# Patient Record
Sex: Female | Born: 1995 | Race: White | Hispanic: No | Marital: Single | Smoking: Former smoker
Health system: Southern US, Community
[De-identification: ages and names within clinical notes are randomized; demographics above are authoritative.]

## PROBLEM LIST (undated history)

## (undated) DIAGNOSIS — F419 Anxiety disorder, unspecified: Secondary | ICD-10-CM

## (undated) DIAGNOSIS — K5900 Constipation, unspecified: Secondary | ICD-10-CM

## (undated) DIAGNOSIS — R079 Chest pain, unspecified: Secondary | ICD-10-CM

## (undated) DIAGNOSIS — R0602 Shortness of breath: Secondary | ICD-10-CM

## (undated) DIAGNOSIS — G4730 Sleep apnea, unspecified: Secondary | ICD-10-CM

## (undated) DIAGNOSIS — F32A Depression, unspecified: Secondary | ICD-10-CM

## (undated) HISTORY — DX: Depression, unspecified: F32.A

## (undated) HISTORY — DX: Shortness of breath: R06.02

## (undated) HISTORY — DX: Constipation, unspecified: K59.00

## (undated) HISTORY — DX: Chest pain, unspecified: R07.9

## (undated) HISTORY — DX: Anxiety disorder, unspecified: F41.9

## (undated) HISTORY — DX: Sleep apnea, unspecified: G47.30

## (undated) HISTORY — PX: TONSILLECTOMY: SUR1361

---
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
## 2015-05-29 ENCOUNTER — Encounter (HOSPITAL_COMMUNITY): Payer: Self-pay | Admitting: Emergency Medicine

## 2015-05-29 ENCOUNTER — Emergency Department (HOSPITAL_COMMUNITY)
Admission: EM | Admit: 2015-05-29 | Discharge: 2015-05-29 | Disposition: A | Discharge status: Home / Self Care | Payer: Self-pay | Attending: Emergency Medicine | Admitting: Emergency Medicine

## 2015-05-29 DIAGNOSIS — R1032 Left lower quadrant pain: Secondary | ICD-10-CM | POA: Insufficient documentation

## 2015-05-29 LAB — COMPREHENSIVE METABOLIC PANEL
ALK PHOS: 51 U/L (ref 38–126)
ALT: 14 U/L (ref 14–54)
AST: 15 U/L (ref 15–41)
Albumin: 4.2 g/dL (ref 3.5–5.0)
Anion gap: 9 (ref 5–15)
BUN: 13 mg/dL (ref 6–20)
CALCIUM: 9.3 mg/dL (ref 8.9–10.3)
CO2: 26 mmol/L (ref 22–32)
Chloride: 103 mmol/L (ref 101–111)
Creatinine, Ser: 0.85 mg/dL (ref 0.44–1.00)
GFR calc Af Amer: >60 mL/min (ref >60)
GLUCOSE: 97 mg/dL (ref 65–99)
Potassium: 4.1 mmol/L (ref 3.5–5.1)
SODIUM: 138 mmol/L (ref 135–145)
Total Bilirubin: 0.4 mg/dL (ref 0.3–1.2)
Total Protein: 7.8 g/dL (ref 6.5–8.1)

## 2015-05-29 LAB — URINALYSIS, ROUTINE W REFLEX MICROSCOPIC
Bilirubin Urine: NEGATIVE
Glucose, UA: NEGATIVE mg/dL
Ketones, ur: NEGATIVE mg/dL
Nitrite: NEGATIVE
PROTEIN: 30 mg/dL — AB
Specific Gravity, Urine: 1.027 (ref 1.005–1.030)
Urobilinogen, UA: 0.2 mg/dL (ref 0.0–1.0)
pH: 6.5 (ref 5.0–8.0)

## 2015-05-29 LAB — URINE MICROSCOPIC-ADD ON

## 2015-05-29 LAB — WET PREP, GENITAL
Trich, Wet Prep: NONE SEEN
Yeast Wet Prep HPF POC: NONE SEEN

## 2015-05-29 LAB — CBC
HCT: 42.1 % (ref 36.0–46.0)
Hemoglobin: 13.7 g/dL (ref 12.0–15.0)
MCH: 27 pg (ref 26.0–34.0)
MCHC: 32.5 g/dL (ref 30.0–36.0)
MCV: 83 fL (ref 78.0–100.0)
PLATELETS: 312 10*3/uL (ref 150–400)
RBC: 5.07 MIL/uL (ref 3.87–5.11)
RDW: 13.4 % (ref 11.5–15.5)
WBC: 12.5 10*3/uL — AB (ref 4.0–10.5)

## 2015-05-29 LAB — LIPASE, BLOOD: Lipase: 25 U/L (ref 22–51)

## 2015-05-29 LAB — I-STAT BETA HCG BLOOD, ED (MC, WL, AP ONLY): I-stat hCG, quantitative: <5 m[IU]/mL (ref <5)

## 2015-05-29 MED ORDER — IBUPROFEN 800 MG PO TABS: 800.0000 mg | ORAL_TABLET | Freq: Three times a day (TID) | ORAL | Status: DC

## 2015-05-29 NOTE — ED Notes (Signed)
Family at bedside. 

## 2015-05-29 NOTE — ED Notes (Signed)
Pt from home c/o lower abdominal pain, nausea, and vomiting, since Friday. She reports she felt constipated and took a laxative without relief. NO urinary symptoms

## 2015-05-29 NOTE — ED Provider Notes (Signed)
CSN: [id number]     Arrival date & time 05/29/15  4098 History   First MD Initiated Contact with Patient 05/29/15 0701     Chief Complaint  Patient presents with  . Abdominal Pain     (Consider location/radiation/quality/duration/timing/severity/associated sxs/prior Treatment) HPI Patient reports 2 days of lower abdominal pain. She reports it somewhat more to the left than the right. She has had a feeling like cramping is in menstrual pain as well as discomfort is in need to have a bowel movement. She tried a laxative and has had some loose stool since then but it has not relieved her pain. She does not have any associated back pain or fever. She reports she has been nauseated. She reports she did vomit once. She reports she ate at a [employer] Thursday evening and had a chicken, bacon wrap. She states she hasn't felt good since Friday. She however denies significant diarrhea. She reports  loose stool did not start until after trying the laxative. Her mother recalls that about a week ago she was taking some over-the-counter cranberry pills for some urinary symptoms. Patient denies currently having urinary symptoms. She denies abnormal vaginal discharge or bleeding. She denies sexual activity. History reviewed. No pertinent past medical history. History reviewed. No pertinent past surgical history. No family history on file. History  Substance Use Topics  . Smoking status: Never Smoker   . Smokeless tobacco: Not on file  . Alcohol Use: Yes     Comment: social   OB History    No data available     Review of Systems  10 Systems reviewed and are negative for acute change except as noted in the HPI.   Allergies  Review of patient's allergies indicates no known allergies.  Home Medications   Prior to Admission medications   Medication Sig Start Date End Date Taking? Authorizing Provider  ibuprofen (ADVIL,MOTRIN) 800 MG tablet Take 1 tablet (800 mg total) by mouth 3 (three) times daily.  05/29/15   Arby Barrette, MD   BP 130/85 mmHg  Pulse 85  Temp(Src) 98.1 F (36.7 C) (Oral)  Resp 18  SpO2 100%  LMP 05/08/2015 Physical Exam  Constitutional: She is oriented to person, place, and time. She appears well-developed and well-nourished.  HENT:  Head: Normocephalic and atraumatic.  Eyes: EOM are normal. Pupils are equal, round, and reactive to light.  Neck: Neck supple.  Cardiovascular: Normal rate, regular rhythm, normal heart sounds and intact distal pulses.   Pulmonary/Chest: Effort normal and breath sounds normal.  Abdominal: Soft. Bowel sounds are normal. She exhibits no distension. There is tenderness (Moderate lower abdominal pain diffusely. More to the left than the right. No guarding.).  Genitourinary:  Normal extraocular genitalia. Speculum examination moderate amount of whitish discharge in the vaginal vault. Cervix normal in appearance without friability. Bimanual examination no adnexal tenderness or cervical tenderness.  Musculoskeletal: Normal range of motion. She exhibits no edema.  Neurological: She is alert and oriented to person, place, and time. She has normal strength. Coordination normal. GCS eye subscore is 4. GCS verbal subscore is 5. GCS motor subscore is 6.  Skin: Skin is warm, dry and intact.  Psychiatric: She has a normal mood and affect.    ED Course  Procedures (including critical care time) Labs Review Labs Reviewed  WET PREP, GENITAL - Abnormal; Notable for the following:    Clue Cells Wet Prep HPF POC FEW (*)    WBC, Wet Prep HPF POC FEW (*)  All other components within normal limits  CBC - Abnormal; Notable for the following:    WBC 12.5 (*)    All other components within normal limits  URINALYSIS, ROUTINE W REFLEX MICROSCOPIC (NOT AT [facility]) - Abnormal; Notable for the following:    APPearance TURBID (*)    Hgb urine dipstick SMALL (*)    Protein, ur 30 (*)    Leukocytes, UA MODERATE (*)    All other components within normal  limits  URINE MICROSCOPIC-ADD ON - Abnormal; Notable for the following:    Squamous Epithelial / LPF MANY (*)    Bacteria, UA MANY (*)    All other components within normal limits  URINE CULTURE  LIPASE, BLOOD  COMPREHENSIVE METABOLIC PANEL  I-STAT BETA HCG BLOOD, ED ([facility], [facility], [facility] ONLY)  GC/CHLAMYDIA PROBE AMP ([place]) NOT AT [facility]    Imaging Review No results found.   EKG Interpretation None      MDM   Final diagnoses:  Left lower quadrant pain   Patient is otherwise well in appearance. She does have diffuse lower abdominal pain. This does not localize to the right and is somewhat more pronounced on the left. I have low suspicion for appendicitis. Pelvic examination is unremarkable and patient denies sexual activity. Consideration is given for possible viral versus food related illness. Patient shows no signs of dehydration, she is not febrile and is not having vomiting. At this time I do feel she is safe for discharge and conservative management.    Arby Barrette, MD 05/29/15 [phone]

## 2015-05-29 NOTE — ED Notes (Signed)
Pt made aware of need for urine sample however, is unable to void at this time. Pt was also asked not to drink anymore of her water and she replies  "I thought you wanted a urine sample". Pt was made aware that she may need a CT scan to please not drink anymore.

## 2015-05-29 NOTE — Discharge Instructions (Signed)
Abdominal Pain, Women °Abdominal (stomach, pelvic, or belly) pain can be caused by many things. It is important to tell your doctor: °· The location of the pain. °· Does it come and go or is it present all the time? °· Are there things that start the pain (eating certain foods, exercise)? °· Are there other symptoms associated with the pain (fever, nausea, vomiting, diarrhea)? °All of this is helpful to know when trying to find the cause of the pain. °CAUSES  °· Stomach: virus or bacteria infection, or ulcer. °· Intestine: appendicitis (inflamed appendix), regional ileitis (Crohn's disease), ulcerative colitis (inflamed colon), irritable bowel syndrome, diverticulitis (inflamed diverticulum of the colon), or cancer of the stomach or intestine. °· Gallbladder disease or stones in the gallbladder. °· Kidney disease, kidney stones, or infection. °· Pancreas infection or cancer. °· Fibromyalgia (pain disorder). °· Diseases of the female organs: °¨ Uterus: fibroid (non-cancerous) tumors or infection. °¨ Fallopian tubes: infection or tubal pregnancy. °¨ Ovary: cysts or tumors. °¨ Pelvic adhesions (scar tissue). °¨ Endometriosis (uterus lining tissue growing in the pelvis and on the pelvic organs). °¨ Pelvic congestion syndrome (female organs filling up with blood just before the menstrual period). °¨ Pain with the menstrual period. °¨ Pain with ovulation (producing an egg). °¨ Pain with an IUD (intrauterine device, birth control) in the uterus. °¨ Cancer of the female organs. °· Functional pain (pain not caused by a disease, may improve without treatment). °· Psychological pain. °· Depression. °DIAGNOSIS  °Your doctor will decide the seriousness of your pain by doing an examination. °· Blood tests. °· X-rays. °· Ultrasound. °· CT scan (computed tomography, special type of X-ray). °· MRI (magnetic resonance imaging). °· Cultures, for infection. °· Barium enema (dye inserted in the large intestine, to better view it with  X-rays). °· Colonoscopy (looking in intestine with a lighted tube). °· Laparoscopy (minor surgery, looking in abdomen with a lighted tube). °· Major abdominal exploratory surgery (looking in abdomen with a large incision). °TREATMENT  °The treatment will depend on the cause of the pain.  °· Many cases can be observed and treated at home. °· Over-the-counter medicines recommended by your caregiver. °· Prescription medicine. °· Antibiotics, for infection. °· Birth control pills, for painful periods or for ovulation pain. °· Hormone treatment, for endometriosis. °· Nerve blocking injections. °· Physical therapy. °· Antidepressants. °· Counseling with a psychologist or psychiatrist. °· Minor or major surgery. °HOME CARE INSTRUCTIONS  °· Do not take laxatives, unless directed by your caregiver. °· Take over-the-counter pain medicine only if ordered by your caregiver. Do not take aspirin because it can cause an upset stomach or bleeding. °· Try a clear liquid diet (broth or water) as ordered by your caregiver. Slowly move to a bland diet, as tolerated, if the pain is related to the stomach or intestine. °· Have a thermometer and take your temperature several times a day, and record it. °· Bed rest and sleep, if it helps the pain. °· Avoid sexual intercourse, if it causes pain. °· Avoid stressful situations. °· Keep your follow-up appointments and tests, as your caregiver orders. °· If the pain does not go away with medicine or surgery, you may try: °¨ Acupuncture. °¨ Relaxation exercises (yoga, meditation). °¨ Group therapy. °¨ Counseling. °SEEK MEDICAL CARE IF:  °· You notice certain foods cause stomach pain. °· Your home care treatment is not helping your pain. °· You need stronger pain medicine. °· You want your IUD removed. °· You feel faint or   lightheaded. °· You develop nausea and vomiting. °· You develop a rash. °· You are having side effects or an allergy to your medicine. °SEEK IMMEDIATE MEDICAL CARE IF:  °· Your  pain does not go away or gets worse. °· You have a fever. °· Your pain is felt only in portions of the abdomen. The right side could possibly be appendicitis. The left lower portion of the abdomen could be colitis or diverticulitis. °· You are passing blood in your stools (bright red or black tarry stools, with or without vomiting). °· You have blood in your urine. °· You develop chills, with or without a fever. °· You pass out. °MAKE SURE YOU:  °· Understand these instructions. °· Will watch your condition. °· Will get help right away if you are not doing well or get worse. °Document Released: 08/12/2007 Document Revised: 03/01/2014 Document Reviewed: 09/01/2009 °ExitCare® Patient Information ©2015 [employer]. This information is not intended to replace advice given to you by your health care provider. Make sure you discuss any questions you have with your health care provider. ° °

## 2015-05-30 LAB — GC/CHLAMYDIA PROBE AMP (~~LOC~~) NOT AT ARMC
Chlamydia: POSITIVE — AB
Neisseria Gonorrhea: NEGATIVE

## 2015-06-01 ENCOUNTER — Telehealth (HOSPITAL_COMMUNITY): Payer: Self-pay

## 2015-06-01 NOTE — Telephone Encounter (Signed)
Results received from [facility].  (+) Chlamydia. No antibiotic treatment or Prescription given for STD.  Chart to MD office for review.  [facility] form attached. 

## 2015-06-04 ENCOUNTER — Telehealth (HOSPITAL_COMMUNITY): Payer: Self-pay | Admitting: Emergency Medicine

## 2015-06-04 NOTE — Telephone Encounter (Signed)
Post ED Visit - Positive Culture Follow-up: Successful Patient Follow-Up   Positive Chlamydia culture   Patient discharged without antimicrobial prescription and treatment is now indicated  Organism is resistant to prescribed ED discharge antimicrobial  Patient with positive blood cultures  Changes discussed with ED provider: Ladona Mow, PA New antibiotic prescription: Azithromycin one gram PO x once Called to [facility] [phone]  Contacted patient, date 06/04/15, time 1602 ID verified, patient notified of positive Chlamydia and need for treatment. STD instructions provided, patient verbalized understanding. RX called to [facility] [phone].  Jiles Harold 06/04/2015, 4:15 PM

## 2016-08-09 ENCOUNTER — Encounter (HOSPITAL_COMMUNITY): Payer: Self-pay | Admitting: Emergency Medicine

## 2016-08-09 ENCOUNTER — Emergency Department (HOSPITAL_COMMUNITY)
Admission: EM | Admit: 2016-08-09 | Discharge: 2016-08-10 | Disposition: A | Discharge status: Home / Self Care | Payer: No Typology Code available for payment source | Attending: Emergency Medicine | Admitting: Emergency Medicine

## 2016-08-09 DIAGNOSIS — Y939 Activity, unspecified: Secondary | ICD-10-CM | POA: Insufficient documentation

## 2016-08-09 DIAGNOSIS — Y92410 Unspecified street and highway as the place of occurrence of the external cause: Secondary | ICD-10-CM | POA: Diagnosis not present

## 2016-08-09 DIAGNOSIS — Y999 Unspecified external cause status: Secondary | ICD-10-CM | POA: Insufficient documentation

## 2016-08-09 DIAGNOSIS — S40021A Contusion of right upper arm, initial encounter: Secondary | ICD-10-CM | POA: Insufficient documentation

## 2016-08-09 DIAGNOSIS — F17200 Nicotine dependence, unspecified, uncomplicated: Secondary | ICD-10-CM | POA: Insufficient documentation

## 2016-08-09 DIAGNOSIS — S4991XA Unspecified injury of right shoulder and upper arm, initial encounter: Secondary | ICD-10-CM | POA: Diagnosis present
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
## 2016-08-09 NOTE — ED Notes (Signed)
Pt called to be taken to treatment room x 1 with no answer 

## 2016-08-10 MED ORDER — CYCLOBENZAPRINE HCL 10 MG PO TABS: 10.0000 mg | ORAL_TABLET | Freq: Two times a day (BID) | ORAL | 0 refills | Status: DC | PRN

## 2016-08-10 MED ORDER — IBUPROFEN 800 MG PO TABS: 800.0000 mg | ORAL_TABLET | Freq: Three times a day (TID) | ORAL | 0 refills | Status: DC
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
## 2018-07-30 ENCOUNTER — Emergency Department (HOSPITAL_BASED_OUTPATIENT_CLINIC_OR_DEPARTMENT_OTHER)
Admission: EM | Admit: 2018-07-30 | Discharge: 2018-07-30 | Disposition: A | Discharge status: Home / Self Care | Payer: Self-pay | Attending: Emergency Medicine | Admitting: Emergency Medicine

## 2018-07-30 ENCOUNTER — Encounter (HOSPITAL_BASED_OUTPATIENT_CLINIC_OR_DEPARTMENT_OTHER): Payer: Self-pay | Admitting: *Deleted

## 2018-07-30 ENCOUNTER — Other Ambulatory Visit: Payer: Self-pay

## 2018-07-30 DIAGNOSIS — F17200 Nicotine dependence, unspecified, uncomplicated: Secondary | ICD-10-CM | POA: Insufficient documentation

## 2018-07-30 DIAGNOSIS — N750 Cyst of Bartholin's gland: Secondary | ICD-10-CM | POA: Insufficient documentation

## 2018-07-30 LAB — PREGNANCY, URINE: Preg Test, Ur: NEGATIVE

## 2018-07-30 MED ORDER — LIDOCAINE HCL (PF) 1 % IJ SOLN
10.0000 mL | Freq: Once | INTRAMUSCULAR | Status: DC
Start: 2018-07-30 — End: 2018-07-30

## 2018-07-30 MED ORDER — CEPHALEXIN 500 MG PO CAPS: 500.0000 mg | ORAL_CAPSULE | Freq: Four times a day (QID) | ORAL | 0 refills | Status: AC

## 2018-07-30 NOTE — Discharge Instructions (Addendum)
You can take Tylenol or Ibuprofen as directed for pain. You can alternate Tylenol and Ibuprofen every 4 hours. If you take Tylenol at 1pm, then you can take Ibuprofen at 5pm. Then you can take Tylenol again at 9pm.   Take antibiotics as directed. Please take all of your antibiotics until finished.  As we discussed[facility].  As we discussed today, it appears that you have a Bartholin cyst.  This sometimes can turn into a Bartholin's abscess.  Please closely monitor your symptoms.  Return to the emergency department for any worsening pain in the area, particularly if it is worse with light touch, worsening redness or swelling, fevers or any other worsening or concerning symptoms.

## 2018-07-30 NOTE — ED Triage Notes (Signed)
Pt reports "knot" to her labia, tender to touch. Denies any fevers or other c/o.

## 2018-07-30 NOTE — ED Provider Notes (Signed)
[facility] EMERGENCY DEPARTMENT Provider Note   CSN: [id number] Arrival date & time: 07/30/18  1032     History   Chief Complaint Chief Complaint  Patient presents with  . Abscess    HPI Amanda [name] is a 22 y.o. female no significant past medical history who presents for evaluation of a "bump" on the left vaginal area that has been there for the last 24 hours.  Patient reports that she noticed a swollen area yesterday and was concerned about it.  She states it is slightly uncomfortable but she has been able to sit and walk without any difficulty.  She has not taken any medication for pain.  Patient states she has not noticed any erythema.  She states that it may be appeared slightly hot.  She has not noticed any drainage from the area.  Patient states that she has not had any fevers.  She has been able to urinate without any difficulty.  Patient reports she is currently sexually active with one partner.  They do not use protection.  She states that she had a history of chlamydia probably 4 years ago but no other STDs.  The history is provided by the patient.    History reviewed. No pertinent past medical history.  There are no active problems to display for this patient.   Past Surgical History:  Procedure Laterality Date  . TONSILLECTOMY       OB History   None      Home Medications    Prior to Admission medications   Medication Sig Start Date End Date Taking? Authorizing Provider  cephALEXin (KEFLEX) 500 MG capsule Take 1 capsule (500 mg total) by mouth 4 (four) times daily for 7 days. 07/30/18 08/06/18  Maxwell Caul, PA-C  cyclobenzaprine (FLEXERIL) 10 MG tablet Take 1 tablet (10 mg total) by mouth 2 (two) times daily as needed for muscle spasms. 08/10/16   Fayrene Helper, PA-C  ibuprofen (ADVIL,MOTRIN) 800 MG tablet Take 1 tablet (800 mg total) by mouth 3 (three) times daily. 08/10/16   Fayrene Helper, PA-C    Family History History reviewed. No  pertinent family history.  Social History Social History   Tobacco Use  . Smoking status: Current Every Day Smoker  . Smokeless tobacco: Never Used  Substance Use Topics  . Alcohol use: Yes    Comment: social  . Drug use: Not on file     Allergies   Patient has no known allergies.   Review of Systems Review of Systems  Constitutional: Negative for fever.  Genitourinary: Positive for vaginal pain. Negative for dysuria, hematuria, vaginal bleeding and vaginal discharge.  All other systems reviewed and are negative.    Physical Exam Updated Vital Signs BP 133/83 (BP Location: Right Wrist)   Pulse 92   Temp 98.4 F (36.9 C) (Oral)   Resp 16   Ht 5\' 9"  (1.753 m)   Wt 122.5 kg   LMP 07/13/2018   SpO2 100%   BMI 39.87 kg/m   Physical Exam  Constitutional: She appears well-developed and well-nourished.  HENT:  Head: Normocephalic and atraumatic.  Eyes: Conjunctivae and EOM are normal. Right eye exhibits no discharge. Left eye exhibits no discharge. No scleral icterus.  Pulmonary/Chest: Effort normal.  Genitourinary:     Genitourinary Comments: The exam was performed with a chaperone present. Normal external female genitalia. No lesions, rash. Area of edema noted to the inferior left labia consistent with Bartholin's cyst. No fluctuance, overlying warmth,  or erythema. Minimally tender to palpation.   Neurological: She is alert.  Skin: Skin is warm and dry.  Psychiatric: She has a normal mood and affect. Her speech is normal and behavior is normal.  Nursing note and vitals reviewed.    ED Treatments / Results  Labs (all labs ordered are listed, but only abnormal results are displayed) Labs Reviewed  PREGNANCY, URINE    EKG None  Radiology No results found.  Procedures Procedures (including critical care time)  Medications Ordered in ED Medications - No data to display   Initial Impression / Assessment and Plan / ED Course  I have reviewed the  triage vital signs and the nursing notes.  Pertinent labs & imaging results that were available during my care of the patient were reviewed by me and considered in my medical decision making (see chart for details).     22 year old female who presents for evaluation of swelling to left labia x24 hours.  Reports it is uncomfortable but has been able to sit without any difficulty.  No fevers.  Has not noticed any discharge.  No difficulty urinating. Patient is afebrile, non-toxic appearing, sitting comfortably on examination table. Vital signs reviewed and stable.  On exam, patient has an area of edema noted to the left labial.  No overlying fluctuance, overlying warmth, erythema.  On palpation, it appears consistent with a cyst rather than an abscess additionally, the patient has minimal tenderness over the area.  Bedside ultrasound performed showed approximately 2 cm area.  At this time, it does not appear to be consistent with an abscess that would require I&D here in the ED.   I discussed with patient that a Bartholin cyst can lead to a Bartholin's abscess.  Given exam, will plan to refer her outpatient to OB/GYN for further evaluation.  Patient is agreeable.  I discussed with patient regarding strict return precautions and that if the area becomes bigger, more painful, warm or erythematous, she needs to come to the ED for further evaluation as it potentially could turn into an abscess. Patient had ample opportunity for questions and discussion. All patient's questions were answered with full understanding. Strict return precautions discussed. Patient expresses understanding and agreement to plan.   Final Clinical Impressions(s) / ED Diagnoses   Final diagnoses:  Bartholin's cyst    ED Discharge Orders         Ordered    cephALEXin (KEFLEX) 500 MG capsule  4 times daily     07/30/18 1319           Rosana Hoes 07/30/18 2008    Alvira Monday, MD 07/31/18 [phone]

## 2018-08-01 ENCOUNTER — Encounter: Payer: Self-pay | Admitting: Obstetrics & Gynecology

## 2018-08-01 ENCOUNTER — Ambulatory Visit (INDEPENDENT_AMBULATORY_CARE_PROVIDER_SITE_OTHER): Payer: Self-pay | Admitting: Obstetrics & Gynecology

## 2018-08-01 VITALS — BP 120/64 | HR 78 | Ht 69.0 in | Wt 268.3 lb

## 2018-08-01 DIAGNOSIS — N750 Cyst of Bartholin's gland: Secondary | ICD-10-CM

## 2018-08-01 NOTE — Progress Notes (Signed)
   Subjective:    Patient ID: Amanda [name], female    DOB: [birth year], 22 y.o.   MRN: [id number]  HPI  22 yo P0 G0 here for evaluation of a left Bartholin's cyst for the last 4-7 days. She was seen recently in the ER and given the diagnosis and a referral for this office. She is using a homeopathic salve on the cyst and thinks that it has decreased in size somewhat.   Review of Systems She lives with her boyfriend. She works at [employer]. She has had her flu vaccine this season.    Objective:   Physical Exam Breathing, conversing, and ambulating normally Obese, well hydrated  female, no apparent distress Her labial exam is significant for a 4 cm left Bartholin's cyst. It is mildly tender with palpation, but not exquisitely so.     Assessment & Plan:  Preventative care- She plans to get her pap smear [facility] the health dept. I offered to I&D the cyst today but she is going on a trip in the near future and doesn't want to deal with a Word catheter during her trip. She will continue to use the salve. If the cyst is still there when she returns from her trip, she would like to have it drained at that time.

## 2018-08-01 NOTE — Progress Notes (Signed)
PHQ-9 and GAD-7 elevated. Pt declined to see [facility].  

## 2020-12-24 ENCOUNTER — Other Ambulatory Visit: Payer: Self-pay

## 2020-12-24 ENCOUNTER — Encounter (HOSPITAL_BASED_OUTPATIENT_CLINIC_OR_DEPARTMENT_OTHER): Payer: Self-pay | Admitting: Emergency Medicine

## 2020-12-24 ENCOUNTER — Emergency Department (HOSPITAL_BASED_OUTPATIENT_CLINIC_OR_DEPARTMENT_OTHER)
Admission: EM | Admit: 2020-12-24 | Discharge: 2020-12-24 | Disposition: A | Discharge status: Home / Self Care | Payer: BLUE CROSS/BLUE SHIELD | Attending: Emergency Medicine | Admitting: Emergency Medicine

## 2020-12-24 DIAGNOSIS — M79644 Pain in right finger(s): Secondary | ICD-10-CM | POA: Insufficient documentation

## 2020-12-24 DIAGNOSIS — R238 Other skin changes: Secondary | ICD-10-CM | POA: Diagnosis not present

## 2020-12-24 DIAGNOSIS — Z87891 Personal history of nicotine dependence: Secondary | ICD-10-CM | POA: Diagnosis not present

## 2020-12-24 DIAGNOSIS — L03019 Cellulitis of unspecified finger: Secondary | ICD-10-CM

## 2020-12-24 DIAGNOSIS — M7989 Other specified soft tissue disorders: Secondary | ICD-10-CM | POA: Diagnosis not present

## 2020-12-24 MED ORDER — CEPHALEXIN 250 MG PO CAPS
500.0000 mg | ORAL_CAPSULE | Freq: Once | ORAL | Status: AC
  Administered 2020-12-24: 500 mg via ORAL
  Filled 2020-12-24: qty 2

## 2020-12-24 MED ORDER — CEPHALEXIN 500 MG PO CAPS: 500.0000 mg | ORAL_CAPSULE | Freq: Four times a day (QID) | ORAL | 0 refills | Status: DC

## 2020-12-24 NOTE — Discharge Instructions (Signed)
Begin taking Keflex as prescribed.  Apply warm compresses as frequently as possible for the next several days.  Return to the emergency department if you develop increased pain, increased swelling, redness extending up the finger into the hand/wrist, or other new and concerning symptoms.

## 2020-12-24 NOTE — ED Triage Notes (Signed)
Pt reports right middle finger pain around the nail bed; cuticle area is red, swollen and hot to touch; pt denies injury or drainage; pt denies fever; CMS intact

## 2020-12-24 NOTE — ED Provider Notes (Signed)
[facility] EMERGENCY DEPARTMENT Provider Note   CSN: [id number] Arrival date & time: 12/24/20  2228     History Chief Complaint  Patient presents with  . Hand Pain    Amanda [name] is a 25 y.o. female.  Patient is a 25 year old female with no significant past medical history.  She presents with complaints of right middle finger pain.  Patient has swelling, discoloration, and discomfort adjacent to the nail of this finger.  She denies any injury or trauma.  She denies any fevers, chills, or streaks up the hand.  She has tried applying ice with little relief.  The history is provided by the patient.       History reviewed. No pertinent past medical history.  There are no problems to display for this patient.   Past Surgical History:  Procedure Laterality Date  . TONSILLECTOMY       OB History   No obstetric history on file.     No family history on file.  Social History   Tobacco Use  . Smoking status: Former Games developer  . Smokeless tobacco: Never Used  Substance Use Topics  . Alcohol use: Yes    Comment: social  . Drug use: Never    Home Medications Prior to Admission medications   Medication Sig Start Date End Date Taking? Authorizing Provider  acetaminophen (TYLENOL) 325 MG tablet Take 650 mg by mouth every 6 (six) hours as needed.    [provider]    Allergies    Patient has no known allergies.  Review of Systems   Review of Systems  All other systems reviewed and are negative.   Physical Exam Updated Vital Signs BP (!) 142/89   Pulse 98   Temp 97.9 F (36.6 C) (Oral)   Ht 5\' 9"  (1.753 m)   Wt 122.5 kg   SpO2 99%   BMI 39.87 kg/m   Physical Exam Vitals and nursing note reviewed.  Constitutional:      Appearance: Normal appearance.  HENT:     Head: Normocephalic and atraumatic.  Pulmonary:     Effort: Pulmonary effort is normal.  Skin:    General: Skin is warm and dry.     Comments: The right middle finger  has an area of erythema with a small amount of purulent material adjacent to the nail.  There is no tenderness or swelling of the finger pad.  Neurological:     Mental Status: She is alert.     ED Results / Procedures / Treatments   Labs (all labs ordered are listed, but only abnormal results are displayed) Labs Reviewed - No data to display  EKG None  Radiology No results found.  Procedures Procedures   Medications Ordered in ED Medications - No data to display  ED Course  I have reviewed the triage vital signs and the nursing notes.  Pertinent labs & imaging results that were available during my care of the patient were reviewed by me and considered in my medical decision making (see chart for details).    MDM Rules/Calculators/A&P  Patient presents with finger pain.  Exam and presentation consistent with a paronychia.  Patient offered incision, however declines.  She will be treated with an antibiotic and warm compresses and I believe will do well.  She is to return as needed if symptoms worsen.  This does not appear to be a felon.  Final Clinical Impression(s) / ED Diagnoses Final diagnoses:  None  Rx / DC Orders ED Discharge Orders    None       Geoffery Lyons, MD 12/24/20 2326

## 2021-03-03 ENCOUNTER — Other Ambulatory Visit: Payer: Self-pay

## 2021-03-03 ENCOUNTER — Encounter: Payer: Self-pay | Admitting: Nurse Practitioner

## 2021-03-03 ENCOUNTER — Other Ambulatory Visit: Payer: Self-pay | Admitting: Nurse Practitioner

## 2021-03-03 ENCOUNTER — Ambulatory Visit (INDEPENDENT_AMBULATORY_CARE_PROVIDER_SITE_OTHER): Payer: BLUE CROSS/BLUE SHIELD | Admitting: Nurse Practitioner

## 2021-03-03 VITALS — BP 139/82 | HR 87 | Ht 69.0 in | Wt 265.0 lb

## 2021-03-03 DIAGNOSIS — N611 Abscess of the breast and nipple: Secondary | ICD-10-CM | POA: Diagnosis not present

## 2021-03-03 MED ORDER — AMOXICILLIN-POT CLAVULANATE 875-125 MG PO TABS: 1.0000 | ORAL_TABLET | Freq: Two times a day (BID) | ORAL | 0 refills | Status: DC

## 2021-03-03 NOTE — Progress Notes (Signed)
Pain in right breast since Sunday and then felt a small lump on Wednesday

## 2021-03-03 NOTE — Patient Instructions (Signed)
Thank you for allowing me to be a part of your health care team. Take the antibiotics as directed. Apply warm wet compresses to the area for 15 minutes 3 times a day. Take tylenol or ibuprofen as needed for pain. Your ultrasound results will be in "My chart" and we will call you to discuss results as well.    Skin Abscess A skin abscess is an infected area of your skin that contains pus and other material. An abscess can happen in any part of your body. Some abscesses break open (rupture) on their own. Most continue to get worse unless they are treated. The infection can spread deeper into the body and into your blood, which can make you feel sick. A skin abscess is caused by germs that enter the skin through a cut or scrape. It can also be caused by blocked oil and sweat glands or infected hair follicles. This condition is usually treated by:  Draining the pus.  Taking antibiotic medicines.  Placing a warm, wet washcloth over the abscess. Follow these instructions at home: Medicines  Take over-the-counter and prescription medicines only as told by your doctor.  If you were prescribed an antibiotic medicine, take it as told by your doctor. Do not stop taking the antibiotic even if you start to feel better.   Abscess care  If you have an abscess that has not drained, place a warm, clean, wet washcloth over the abscess several times a day. Do this as told by your doctor.  Follow instructions from your doctor about how to take care of your abscess. Make sure you: ? Cover the abscess with a bandage (dressing). ? Change your bandage or gauze as told by your doctor. ? Wash your hands with soap and water before you change the bandage or gauze. If you cannot use soap and water, use hand sanitizer.  Check your abscess every day for signs that the infection is getting worse. Check for: ? More redness, swelling, or pain. ? More fluid or blood. ? Warmth. ? More pus or a bad smell.   General  instructions  To avoid spreading the infection: ? Do not share personal care items, towels, or hot tubs with others. ? Avoid making skin-to-skin contact with other people.  Keep all follow-up visits as told by your doctor. This is important. Contact a doctor if:  You have more redness, swelling, or pain around your abscess.  You have more fluid or blood coming from your abscess.  Your abscess feels warm when you touch it.  You have more pus or a bad smell coming from your abscess.  You have a fever.  Your muscles ache.  You have chills.  You feel sick. Get help right away if:  You have very bad (severe) pain.  You see red streaks on your skin spreading away from the abscess. Summary  A skin abscess is an infected area of your skin that contains pus and other material.  The abscess is caused by germs that enter the skin through a cut or scrape. It can also be caused by blocked oil and sweat glands or infected hair follicles.  Follow your doctor's instructions on caring for your abscess, taking medicines, preventing infections, and keeping follow-up visits. This information is not intended to replace advice given to you by your health care provider. Make sure you discuss any questions you have with your health care provider. Document Revised: 05/21/2019 Document Reviewed: 11/28/2017 Elsevier Patient Education  2021 [employer].  Breast Self-Awareness Breast self-awareness is knowing how your breasts look and feel. Doing breast self-awareness is important. It allows you to catch a breast problem early while it is still small and can be treated. All women should do breast self-awareness, including women who have had breast implants. Tell your doctor if you notice a change in your breasts. What you need:  A mirror.  A well-lit room. How to do a breast self-exam A breast self-exam is one way to learn what is normal for your breasts and to check for changes. To do a breast  self-exam: Look for changes 1. Take off all the clothes above your waist. 2. Stand in front of a mirror in a room with good lighting. 3. Put your hands on your hips. 4. Push your hands down. 5. Look at your breasts and nipples in the mirror to see if one breast or nipple looks different from the other. Check to see if: ? The shape of one breast is different. ? The size of one breast is different. ? There are wrinkles, dips, and bumps in one breast and not the other. 6. Look at each breast for changes in the skin, such as: ? Redness. ? Scaly areas. 7. Look for changes in your nipples, such as: ? Liquid around the nipples. ? Bleeding. ? Dimpling. ? Redness. ? A change in where the nipples are.   Feel for changes 1. Lie on your back on the floor. 2. Feel each breast. To do this, follow these steps: ? Pick a breast to feel. ? Put the arm closest to that breast above your head. ? Use your other arm to feel the nipple area of your breast. Feel the area with the pads of your three middle fingers by making small circles with your fingers. For the first circle, press lightly. For the second circle, press harder. For the third circle, press even harder. ? Keep making circles with your fingers at the different pressures as you move down your breast. Stop when you feel your ribs. ? Move your fingers a little toward the center of your body. ? Start making circles with your fingers again, this time going up until you reach your collarbone. ? Keep making up-and-down circles until you reach your armpit. Remember to keep using the three pressures. ? Feel the other breast in the same way. 3. Sit or stand in the tub or shower. 4. With soapy water on your skin, feel each breast the same way you did in step 2 when you were lying on the floor.   Write down what you find Writing down what you find can help you remember what to tell your doctor. Write down:  What is normal for each breast.  Any changes  you find in each breast, including: ? The kind of changes you find. ? Whether you have pain. ? Size and location of any lumps.  When you last had your menstrual period. General tips  Check your breasts every month.  If you are breastfeeding, the best time to check your breasts is after you feed your baby or after you use a breast pump.  If you get menstrual periods, the best time to check your breasts is 5-7 days after your menstrual period is over.  With time, you will become comfortable with the self-exam, and you will begin to know if there are changes in your breasts. Contact a doctor if you:  See a change in the shape or size of  your breasts or nipples.  See a change in the skin of your breast or nipples, such as red or scaly skin.  Have fluid coming from your nipples that is not normal.  Find a lump or thick area that was not there before.  Have pain in your breasts.  Have any concerns about your breast health. Summary  Breast self-awareness includes looking for changes in your breasts, as well as feeling for changes within your breasts.  Breast self-awareness should be done in front of a mirror in a well-lit room.  You should check your breasts every month. If you get menstrual periods, the best time to check your breasts is 5-7 days after your menstrual period is over.  Let your doctor know of any changes you see in your breasts, including changes in size, changes on the skin, pain or tenderness, or fluid from your nipples that is not normal. This information is not intended to replace advice given to you by your health care provider. Make sure you discuss any questions you have with your health care provider. Document Revised: 06/03/2018 Document Reviewed: 06/03/2018 Elsevier Patient Education  [place].

## 2021-03-03 NOTE — Progress Notes (Signed)
   Subjective:    Patient ID: Amanda [name], female    DOB: 20-May-1996, 25 y.o.   MRN: [id number]  HPI Allyah Heather Pham is a 25 y.o. G0P0000 last Pap was 6/21 and was normal. She  presents to the [facility] [place] for a new GYN visit. She reports she has had breast tenderness on the right and a small knot that she feels under the nipple. The symptoms started 5 days ago. She reports no drainage from the nipple. Patient denies fever or chills or change in skin color.  History reviewed. No pertinent past medical history. Past Surgical History:  Procedure Laterality Date  . TONSILLECTOMY     Current Outpatient Medications on File Prior to Visit  Medication Sig Dispense Refill  . phentermine 37.5 MG capsule Take 37.5 mg by mouth every morning.     No current facility-administered medications on file prior to visit.   No Known Allergies  Review of Systems  Genitourinary:       Right breast tenderness and knot palpated under nipple.  All other systems reviewed and are negative.      Objective: BP 139/82   Pulse 87   Ht 5\' 9"  (1.753 m)   Wt 265 lb (120.2 kg)   LMP 02/22/2021 (Approximate)   BMI 39.13 kg/m     Physical Exam Vitals reviewed.  HENT:     Head: Normocephalic.  Eyes:     Extraocular Movements: Extraocular movements intact.     Conjunctiva/sclera: Conjunctivae normal.  Cardiovascular:     Rate and Rhythm: Normal rate.  Pulmonary:     Effort: Pulmonary effort is normal.  Chest:  Breasts:     Right: Mass, skin change and tenderness present. No axillary adenopathy.     Left: Normal. No axillary adenopathy.        Comments: Right breast with swelling and erythema of the right nipple. There is a small node palpated under the nipple.  Musculoskeletal:     Cervical back: Neck supple.  Lymphadenopathy:     Upper Body:     Right upper body: No axillary adenopathy.     Left upper body: No axillary adenopathy.  Skin:    General: Skin is warm and dry.  Neurological:      General: No focal deficit present.     Mental Status: She is alert.  Psychiatric:        Mood and Affect: Mood normal.       Assessment & Plan:  1. Abscess of right breast - 02/24/2021 BREAST LTD UNI RIGHT INC AXILLA; Future - amoxicillin-clavulanate (AUGMENTIN) 875-125 MG tablet; Take 1 tablet by mouth 2 (two) times daily.  Dispense: 14 tablet; Refill: 0  Discussed with the patient clinical findings and plan of care. Patient given the opportunity to ask questions. All questioned fully answered and patient voices understanding. Will schedule for breast ultrasound and she will take the antibiotics and use warm wet compresses to the area 3 times a day.

## 2021-03-10 ENCOUNTER — Other Ambulatory Visit: Payer: BLUE CROSS/BLUE SHIELD

## 2021-03-16 ENCOUNTER — Other Ambulatory Visit: Payer: Self-pay

## 2021-03-16 ENCOUNTER — Ambulatory Visit
Admission: RE | Admit: 2021-03-16 | Discharge: 2021-03-16 | Disposition: A | Discharge status: Home / Self Care | Payer: BLUE CROSS/BLUE SHIELD | Source: Ambulatory Visit | Attending: Nurse Practitioner | Admitting: Nurse Practitioner

## 2021-03-16 DIAGNOSIS — N611 Abscess of the breast and nipple: Secondary | ICD-10-CM

## 2021-05-29 ENCOUNTER — Other Ambulatory Visit: Payer: Self-pay

## 2021-05-29 ENCOUNTER — Encounter (HOSPITAL_BASED_OUTPATIENT_CLINIC_OR_DEPARTMENT_OTHER): Payer: Self-pay | Admitting: *Deleted

## 2021-05-29 ENCOUNTER — Emergency Department (HOSPITAL_BASED_OUTPATIENT_CLINIC_OR_DEPARTMENT_OTHER): Payer: BLUE CROSS/BLUE SHIELD | Admitting: Radiology

## 2021-05-29 ENCOUNTER — Emergency Department (HOSPITAL_BASED_OUTPATIENT_CLINIC_OR_DEPARTMENT_OTHER)
Admission: EM | Admit: 2021-05-29 | Discharge: 2021-05-29 | Disposition: A | Discharge status: Home / Self Care | Payer: BLUE CROSS/BLUE SHIELD | Attending: Emergency Medicine | Admitting: Emergency Medicine

## 2021-05-29 DIAGNOSIS — J069 Acute upper respiratory infection, unspecified: Secondary | ICD-10-CM | POA: Diagnosis not present

## 2021-05-29 DIAGNOSIS — Z87891 Personal history of nicotine dependence: Secondary | ICD-10-CM | POA: Insufficient documentation

## 2021-05-29 DIAGNOSIS — R059 Cough, unspecified: Secondary | ICD-10-CM | POA: Diagnosis present

## 2021-05-29 MED ORDER — ALBUTEROL SULFATE HFA 108 (90 BASE) MCG/ACT IN AERS
2.0000 | INHALATION_SPRAY | Freq: Once | RESPIRATORY_TRACT | Status: AC
  Administered 2021-05-29: 2 via RESPIRATORY_TRACT
  Filled 2021-05-29: qty 6.7

## 2021-05-29 NOTE — ED Triage Notes (Signed)
Several days of cough followed by chest heaviness on left side. Non productive cough. Denies N/V/D

## 2021-05-29 NOTE — Discharge Instructions (Addendum)
Use the inhaler 2 puffs every 4-6 hours as needed if you feel the tightness in your chest.  There is no signs of pneumonia or heart problems today and this will most likely get better in the next few days.

## 2021-05-29 NOTE — ED Provider Notes (Signed)
[facility] EMERGENCY DEPT Provider Note   CSN: [id number] Arrival date & time: 05/29/21  1447     History Chief Complaint  Patient presents with   Chest Pain   Shortness of Breath    Amanda [name] is a 25 y.o. female.  Patient is a 25 year old female with no known medical history who is presenting today with complaints of ongoing cough for the last 1 week but then in the last 2 days she has had heaviness in the chest and some shortness of breath that is present almost all the time.  She is not aware that she has been running any fever and denies significant nasal congestion.  No nausea vomiting or diarrhea.  No abdominal pain.  Symptoms are worse with eating.  She has not been exerting herself a whole lot so she is unaware if it is worse with exertion.  She did test for COVID yesterday and it was negative.  She has not tried taking anything for this.  No prior history of asthma and she is not a smoker but used to be 1.  She does not take any birth control pills and denies any unilateral leg pain or swelling.  The history is provided by the patient.  Chest Pain Associated symptoms: shortness of breath   Shortness of Breath Associated symptoms: chest pain       History reviewed. No pertinent past medical history.  There are no problems to display for this patient.   Past Surgical History:  Procedure Laterality Date   TONSILLECTOMY       OB History     Gravida  0   Para  0   Term  0   Preterm  0   AB  0   Living  0      SAB  0   IAB  0   Ectopic  0   Multiple  0   Live Births  0           No family history on file.  Social History   Tobacco Use   Smoking status: Former   Smokeless tobacco: Never  Substance Use Topics   Alcohol use: Yes    Comment: social   Drug use: Never    Home Medications Prior to Admission medications   Medication Sig Start Date End Date Taking? Authorizing Provider  phentermine 37.5 MG capsule Take  37.5 mg by mouth every morning.   Yes [provider]  amoxicillin-clavulanate (AUGMENTIN) 875-125 MG tablet Take 1 tablet by mouth 2 (two) times daily. 03/03/21   Janne Napoleon, NP    Allergies    Patient has no known allergies.  Review of Systems   Review of Systems  Respiratory:  Positive for shortness of breath.   Cardiovascular:  Positive for chest pain.  All other systems reviewed and are negative.  Physical Exam Updated Vital Signs BP (!) 142/96 (BP Location: Right Arm)   Pulse 87   Temp 98.3 F (36.8 C) (Oral)   Resp 18   Ht 5\' 9"  (1.753 m)   Wt 119.7 kg   LMP 05/05/2021 (Exact Date)   SpO2 100%   BMI 38.99 kg/m   Physical Exam Vitals and nursing note reviewed.  Constitutional:      General: She is in acute distress.     Appearance: Normal appearance. She is well-developed.  HENT:     Head: Normocephalic and atraumatic.  Eyes:     Pupils: Pupils are equal,  round, and reactive to light.  Cardiovascular:     Rate and Rhythm: Normal rate and regular rhythm.     Heart sounds: Normal heart sounds. No murmur heard.   No friction rub.  Pulmonary:     Effort: Pulmonary effort is normal.     Breath sounds: Normal breath sounds. No wheezing or rales.     Comments: Mild decreased breath sounds at the bases Abdominal:     General: Bowel sounds are normal. There is no distension.     Palpations: Abdomen is soft.     Tenderness: There is no abdominal tenderness. There is no guarding or rebound.  Musculoskeletal:        General: No tenderness. Normal range of motion.     Cervical back: Normal range of motion and neck supple.     Right lower leg: No edema.     Left lower leg: No edema.     Comments: No edema  Skin:    General: Skin is warm and dry.     Findings: No rash.  Neurological:     Mental Status: She is alert and oriented to person, place, and time. Mental status is at baseline.     Cranial Nerves: No cranial nerve deficit.  Psychiatric:        Mood  and Affect: Mood normal.        Behavior: Behavior normal.        Thought Content: Thought content normal.    ED Results / Procedures / Treatments   Labs (all labs ordered are listed, but only abnormal results are displayed) Labs Reviewed - No data to display  EKG EKG Interpretation  Date/Time:  Monday May 29 2021 14:54:21 EDT Ventricular Rate:  90 PR Interval:  160 QRS Duration: 103 QT Interval:  383 QTC Calculation: 469 R Axis:   67 Text Interpretation: Sinus rhythm Normal ECG No previous tracing Confirmed by Gwyneth Sprout ([id number]) on 05/29/2021 2:57:50 PM  Radiology DG Chest 2 View  Result Date: 05/29/2021 CLINICAL DATA:  Shortness of breath, chest heaviness, and a little cough for couple days EXAM: CHEST - 2 VIEW COMPARISON:  None FINDINGS: Normal heart size, mediastinal contours, and pulmonary vascularity. Azygos fissure noted. Lungs clear. No infiltrate, pleural effusion, or pneumothorax. Osseous structures unremarkable. IMPRESSION: No acute abnormalities. Electronically Signed   By: Ulyses Southward M.D.   On: 05/29/2021 15:38    Procedures Procedures   Medications Ordered in ED Medications  albuterol (VENTOLIN HFA) 108 (90 Base) MCG/ACT inhaler 2 puff (has no administration in time range)    ED Course  I have reviewed the triage vital signs and the nursing notes.  Pertinent labs & imaging results that were available during my care of the patient were reviewed by me and considered in my medical decision making (see chart for details).    MDM Rules/Calculators/A&P                           Patient is a well-appearing healthy 25 year old female presenting today with symptoms of chest heaviness and shortness of breath.  Patient's had URI symptoms for the last 1 week with ongoing dry cough.  She has no prior history of asthma and on exam there is no wheezing however concern for possible post viral bronchitis.  Low suspicion for PE and patient is PERC negative.  EKG is  within normal limits and low suspicion for ACS at this time.  She is not  describing symptoms to suggest dissection, pericarditis, myocarditis, GI pathology.  Chest x-ray is pending.  We will give 1 dose of albuterol to see if that helps with her symptoms.  She is otherwise very well-appearing on exam and satting 100% on room air.  4:26 PM Itching is within normal limits.  After inhaler she reports feeling a little bit better.  Will discharge with URI and post viral bronchitis  MDM   Amount and/or Complexity of Data Reviewed Tests in the radiology section of CPT: ordered and reviewed Tests in the medicine section of CPT: ordered and reviewed Independent visualization of images, tracings, or specimens: yes     Final Clinical Impression(s) / ED Diagnoses Final diagnoses:  Viral upper respiratory tract infection    Rx / DC Orders ED Discharge Orders     None        Gwyneth Sprout, MD 05/29/21 1628

## 2021-05-29 NOTE — ED Notes (Addendum)
RT Note: Pt. given Albuterol MDI 2 puffs per order, Educated on indications, use, with/without spacer device, able to teach back/use independently.

## 2021-10-12 ENCOUNTER — Telehealth: Payer: Self-pay

## 2021-10-12 NOTE — Telephone Encounter (Signed)
Patient called requesting to get her GI records faxed to our office left her a voicemail to advise request has be made.

## 2021-10-19 ENCOUNTER — Ambulatory Visit (INDEPENDENT_AMBULATORY_CARE_PROVIDER_SITE_OTHER): Payer: BLUE CROSS/BLUE SHIELD | Admitting: Internal Medicine

## 2021-10-19 ENCOUNTER — Encounter: Payer: Self-pay | Admitting: Internal Medicine

## 2021-10-19 VITALS — BP 132/78 | HR 89 | Ht 69.0 in | Wt 258.0 lb

## 2021-10-19 DIAGNOSIS — R140 Abdominal distension (gaseous): Secondary | ICD-10-CM | POA: Diagnosis not present

## 2021-10-19 DIAGNOSIS — R1030 Lower abdominal pain, unspecified: Secondary | ICD-10-CM | POA: Diagnosis not present

## 2021-10-19 DIAGNOSIS — K5900 Constipation, unspecified: Secondary | ICD-10-CM | POA: Diagnosis not present

## 2021-10-19 NOTE — Progress Notes (Signed)
Chief Complaint: Constipation  HPI : 25 year old female with no significant past medical history presents with constipation  Patient states that she has had issues with constipation in the past, but that her constipation issues have worsened over the last 1 to 2 months.  When she does go to the bathroom to have a BM, she does not feel like she empties fully.  She is having on average 1 BM every other day.  Lately has only been having small pellets come out.  Denies having to use digital stimulation.  Has also had some intermittent abdominal cramping.  This cramping is located in the lower abdomen and improves after having a bowel movement.  Endorses issues with bloating.  Denies hematochezia, melena, nausea, vomiting, dysphagia, weight loss.  Denies family history of GI issues.  Denies excessive alcohol use.  For her constipation, she has tried an enema with little to no response, milk of magnesia that did not fully clear her out, and a variety of teas.  She feels like she hydrates well drinking at least 8 cups of water per day.  She is not very active, and does not think that she eats enough fiber per day.  History reviewed. No pertinent past medical history.   Past Surgical History:  Procedure Laterality Date   TONSILLECTOMY     Family History  Problem Relation Age of Onset   Skin cancer Mother    Kidney cancer Mother    Diabetes Father    Hypertension Father    Stomach cancer Neg Hx    Colon cancer Neg Hx    Throat cancer Neg Hx    Pancreatic cancer Neg Hx    Social History   Tobacco Use   Smoking status: Former   Smokeless tobacco: Never  Building services engineerVaping Use   Vaping Use: Never used  Substance Use Topics   Alcohol use: Yes    Comment: social   Drug use: Never   Current Outpatient Medications  Medication Sig Dispense Refill   Ascorbic Acid (C 500/ROSE HIPS PO) Take by mouth.     MAGNESIUM PO Take by mouth.     phentermine 37.5 MG capsule Take 37.5 mg by mouth every morning.      No current facility-administered medications for this visit.   No Known Allergies   Review of Systems: All systems reviewed and negative except where noted in HPI.   Physical Exam: BP 132/78    Pulse 89    Ht 5\' 9"  (1.753 m)    Wt 258 lb (117 kg)    SpO2 98%    BMI 38.10 kg/m  Constitutional: Pleasant,well-developed, female in no acute distress. HEENT: Normocephalic and atraumatic. Conjunctivae are normal. No scleral icterus. Cardiovascular: Normal rate, regular rhythm.  Pulmonary/chest: Effort normal and breath sounds normal. No wheezing, rales or rhonchi. Abdominal: Soft, nondistended, mildly tender in the lower abdomen. Bowel sounds active throughout. There are no masses palpable. No hepatomegaly. Extremities: No edema Neurological: Alert and oriented to person place and time. Skin: Skin is warm and dry. No rashes noted. Psychiatric: Normal mood and affect. Behavior is normal.  Labs 05/2020: TTG IgA negative. IgA nml. CRP nml.  ASSESSMENT AND PLAN: Constipation Lower abdominal cramping Bloating Patient presents with worsening constipation over the last 1-2 months.  Has been noting some lower abdominal cramping that improves with BM.  Will institute therapies for constipation.  At this time she is not demonstrating any alarm symptoms, though will monitor to see if she  responds appropriately to constipation therapies. - Walk at least 30 min/day - Start daily fiber supplement with Benefiber or Metamucil - Start daily Miralax, can uptitrate to TID to achieve at least 1 BM per day - RTC 2 months. If not responsive to the above interventions, could consider further work up for etiology of her constipation such as obtaining thyroid studies and CT A/P  Eulah Pont, MD

## 2021-10-19 NOTE — Patient Instructions (Signed)
If you are age 25 or older, your body mass index should be between 23-30. Your Body mass index is 38.1 kg/m. If this is out of the aforementioned range listed, please consider follow up with your Primary Care Provider.  If you are age 46 or younger, your body mass index should be between 19-25. Your Body mass index is 38.1 kg/m. If this is out of the aformentioned range listed, please consider follow up with your Primary Care Provider.   ________________________________________________________  The [place] [facility] providers would like to encourage you to use [facility] to communicate with providers for non-urgent requests or questions.  Due to long hold times on the telephone, sending your provider a message by [facility] may be a faster and more efficient way to get a response.  Please allow 48 business hours for a response.  Please remember that this is for non-urgent requests.  _______________________________________________________   Start walking 30 minutes per day   Start fiber supplement daily with benefiber of metamucil  Start miralax daily you may titrate up to 3 times a day to clear yourself out.

## 2021-12-20 ENCOUNTER — Ambulatory Visit (INDEPENDENT_AMBULATORY_CARE_PROVIDER_SITE_OTHER): Payer: 59 | Admitting: Internal Medicine

## 2021-12-20 ENCOUNTER — Encounter: Payer: Self-pay | Admitting: Internal Medicine

## 2021-12-20 ENCOUNTER — Other Ambulatory Visit (INDEPENDENT_AMBULATORY_CARE_PROVIDER_SITE_OTHER): Payer: 59

## 2021-12-20 VITALS — BP 114/62 | HR 84 | Ht 69.0 in | Wt 257.2 lb

## 2021-12-20 DIAGNOSIS — K5900 Constipation, unspecified: Secondary | ICD-10-CM

## 2021-12-20 LAB — COMPREHENSIVE METABOLIC PANEL
ALT: 12 U/L (ref 0–35)
AST: 12 U/L (ref 0–37)
Albumin: 4.3 g/dL (ref 3.5–5.2)
Alkaline Phosphatase: 43 U/L (ref 39–117)
BUN: 12 mg/dL (ref 6–23)
CO2: 29 mEq/L (ref 19–32)
Calcium: 9.5 mg/dL (ref 8.4–10.5)
Chloride: 103 mEq/L (ref 96–112)
Creatinine, Ser: 0.84 mg/dL (ref 0.40–1.20)
GFR: 96.42 mL/min (ref >60.00)
Glucose, Bld: 79 mg/dL (ref 70–99)
Potassium: 4.1 mEq/L (ref 3.5–5.1)
Sodium: 137 mEq/L (ref 135–145)
Total Bilirubin: 0.4 mg/dL (ref 0.2–1.2)
Total Protein: 7.6 g/dL (ref 6.0–8.3)

## 2021-12-20 LAB — CBC WITH DIFFERENTIAL/PLATELET
Basophils Absolute: 0.1 10*3/uL (ref 0.0–0.1)
Basophils Relative: 0.6 % (ref 0.0–3.0)
Eosinophils Absolute: 0.1 10*3/uL (ref 0.0–0.7)
Eosinophils Relative: 0.7 % (ref 0.0–5.0)
HCT: 40.9 % (ref 36.0–46.0)
Hemoglobin: 13.5 g/dL (ref 12.0–15.0)
Lymphocytes Relative: 24.4 % (ref 12.0–46.0)
Lymphs Abs: 2.5 10*3/uL (ref 0.7–4.0)
MCHC: 33.1 g/dL (ref 30.0–36.0)
MCV: 84.7 fl (ref 78.0–100.0)
Monocytes Absolute: 0.8 10*3/uL (ref 0.1–1.0)
Monocytes Relative: 7.5 % (ref 3.0–12.0)
Neutro Abs: 6.8 10*3/uL (ref 1.4–7.7)
Neutrophils Relative %: 66.8 % (ref 43.0–77.0)
Platelets: 311 10*3/uL (ref 150.0–400.0)
RBC: 4.83 Mil/uL (ref 3.87–5.11)
RDW: 13.3 % (ref 11.5–15.5)
WBC: 10.2 10*3/uL (ref 4.0–10.5)

## 2021-12-20 LAB — TSH: TSH: 1.49 u[IU]/mL (ref 0.35–5.50)

## 2021-12-20 MED ORDER — LINACLOTIDE 72 MCG PO CAPS: 72.0000 ug | ORAL_CAPSULE | Freq: Every day | ORAL | 2 refills | Status: DC

## 2021-12-20 NOTE — Progress Notes (Signed)
Chief Complaint: Constipation  HPI : 26 year old female with no significant past medical history presents with constipation  Patient states that she has had issues with constipation in the past, but that her constipation issues have worsened over the last 1 to 2 months.  When she does go to the bathroom to have a BM, she does not feel like she empties fully.  She is having on average 1 BM every other day.  Lately has only been having small pellets come out.  Denies having to use digital stimulation.  Has also had some intermittent abdominal cramping.  This cramping is located in the lower abdomen and improves after having a bowel movement.  Endorses issues with bloating.  Denies hematochezia, melena, nausea, vomiting, dysphagia, weight loss.  Denies family history of GI issues.  Denies excessive alcohol use.  For her constipation, she has tried an enema with little to no response, milk of magnesia that did not fully clear her out, and a variety of teas.  She feels like she hydrates well drinking at least 8 cups of water per day.  She is not very active, and does not think that she eats enough fiber per day.  Interval History: Overall she feels like her constipation has improved.  Her lower abdominal discomfort has improved as well.  She has been going to the bathroom to have a BM more frequently, but feels like she is not completely clearing herself out.  When she produces a stool, the appearance is like a mound.  She is having on average 1 bowel movement every other day.  She has been drinking a lot of water and taking her fiber supplement daily.  She has been trying to walk more regularly but has not been able to do this every day.  Since I last saw her in clinic, she started off taking MiraLAX 3 times daily for about a week, and then changed to taking the MiraLAX daily.  She does feel like the MiraLAX has been helping induce BMs for her.   Current Outpatient Medications  Medication Sig Dispense  Refill   MAGNESIUM PO Take by mouth.     phentermine 37.5 MG capsule Take 37.5 mg by mouth every morning.     polyethylene glycol (MIRALAX / GLYCOLAX) 17 g packet Take 17 g by mouth daily.     No current facility-administered medications for this visit.   Review of Systems: All systems reviewed and negative except where noted in HPI.   Physical Exam: BP 114/62    Pulse 84    Ht 5\' 9"  (1.753 m)    Wt 257 lb 3.2 oz (116.7 kg)    BMI 37.98 kg/m  Constitutional: Pleasant,well-developed, female in no acute distress. HEENT: Normocephalic and atraumatic. Conjunctivae are normal. No scleral icterus. Cardiovascular: Normal rate, regular rhythm.  Pulmonary/chest: Effort normal and breath sounds normal. No wheezing, rales or rhonchi. Abdominal: Soft, nondistended, non-tender. Bowel sounds active throughout. There are no masses palpable. No hepatomegaly. Extremities: No edema Neurological: Alert and oriented to person place and time. Skin: Skin is warm and dry. No rashes noted. Psychiatric: Normal mood and affect. Behavior is normal.  Labs 05/2020: TTG IgA negative. IgA nml. CRP nml.  ASSESSMENT AND PLAN: Constipation Overall patient seems to be responding to MiraLAX therapy, daily fiber supplement, and hydration.  However, she still feels like she is not being fully cleared out by the MiraLAX.  When she was taking MiraLAX 3 times daily, she did feel  like she was being fully cleared out, but found it burdensome to take the MiraLAX 3 times daily.  Thus I will go ahead and start the patient on Linzess 72 mg daily.  If this is not effective or the patient finds that the [place] is too expensive, then she can go back to the [place].  Since patient does not follow with a PCP regularly, we will go ahead and update her routine labs today and rule out any thyroid dysfunction. - Check CBC, CMP, TSH - Continue to aim for walking at least 30 min/day - Continue daily fiber supplement - Start Linzess 72 mg QD.  If not effective, then can go back to doing Miralax QD (up to TID)  - RTC 3 months  Christia Reading, MD

## 2021-12-20 NOTE — Patient Instructions (Addendum)
If you are age 26 or younger, your body mass index should be between 19-25. Your Body mass index is 37.98 kg/m. If this is out of the aformentioned range listed, please consider follow up with your Primary Care Provider.  ________________________________________________________  The [place] [facility] providers would like to encourage you to use [facility] to communicate with providers for non-urgent requests or questions.  Due to long hold times on the telephone, sending your provider a message by [facility] may be a faster and more efficient way to get a response.  Please allow 48 business hours for a response.  Please remember that this is for non-urgent requests.  _______________________________________________________  Your provider has requested that you go to the basement level for lab work before leaving today. Press "B" on the elevator. The lab is located at the first door on the left as you exit the elevator.  START Linzess 72 mcg 1 capsule before breakfast. *We have given you sample today and sent a prescription to your pharmacy.  Please call the office in a few weeks to schedule a 3 month follow up.  Thank you for entrusting me with your care and choosing [facility].  Dr Leonides Schanz

## 2022-02-26 ENCOUNTER — Other Ambulatory Visit: Payer: Self-pay

## 2022-02-26 ENCOUNTER — Emergency Department (HOSPITAL_COMMUNITY): Payer: Commercial Managed Care - HMO

## 2022-02-26 ENCOUNTER — Encounter (HOSPITAL_COMMUNITY): Payer: Self-pay

## 2022-02-26 ENCOUNTER — Emergency Department (HOSPITAL_COMMUNITY)
Admission: EM | Admit: 2022-02-26 | Discharge: 2022-02-26 | Disposition: A | Discharge status: Home / Self Care | Payer: Commercial Managed Care - HMO | Attending: Emergency Medicine | Admitting: Emergency Medicine

## 2022-02-26 DIAGNOSIS — R079 Chest pain, unspecified: Secondary | ICD-10-CM | POA: Insufficient documentation

## 2022-02-26 DIAGNOSIS — R253 Fasciculation: Secondary | ICD-10-CM | POA: Diagnosis not present

## 2022-02-26 DIAGNOSIS — R200 Anesthesia of skin: Secondary | ICD-10-CM | POA: Insufficient documentation

## 2022-02-26 DIAGNOSIS — M549 Dorsalgia, unspecified: Secondary | ICD-10-CM | POA: Diagnosis not present

## 2022-02-26 DIAGNOSIS — G514 Facial myokymia: Secondary | ICD-10-CM

## 2022-02-26 LAB — CBC WITH DIFFERENTIAL/PLATELET
Abs Immature Granulocytes: 0.02 10*3/uL (ref 0.00–0.07)
Basophils Absolute: 0.1 10*3/uL (ref 0.0–0.1)
Basophils Relative: 1 %
Eosinophils Absolute: 0.1 10*3/uL (ref 0.0–0.5)
Eosinophils Relative: 1 %
HCT: 40.5 % (ref 36.0–46.0)
Hemoglobin: 13.9 g/dL (ref 12.0–15.0)
Immature Granulocytes: 0 %
Lymphocytes Relative: 26 %
Lymphs Abs: 2.4 10*3/uL (ref 0.7–4.0)
MCH: 29.4 pg (ref 26.0–34.0)
MCHC: 34.3 g/dL (ref 30.0–36.0)
MCV: 85.8 fL (ref 80.0–100.0)
Monocytes Absolute: 0.5 10*3/uL (ref 0.1–1.0)
Monocytes Relative: 6 %
Neutro Abs: 6.2 10*3/uL (ref 1.7–7.7)
Neutrophils Relative %: 66 %
Platelets: 327 10*3/uL (ref 150–400)
RBC: 4.72 MIL/uL (ref 3.87–5.11)
RDW: 12.7 % (ref 11.5–15.5)
WBC: 9.4 10*3/uL (ref 4.0–10.5)
nRBC: 0 % (ref 0.0–0.2)

## 2022-02-26 LAB — BASIC METABOLIC PANEL
Anion gap: 6 (ref 5–15)
BUN: 13 mg/dL (ref 6–20)
CO2: 26 mmol/L (ref 22–32)
Calcium: 9.4 mg/dL (ref 8.9–10.3)
Chloride: 105 mmol/L (ref 98–111)
Creatinine, Ser: 0.86 mg/dL (ref 0.44–1.00)
GFR, Estimated: >60 mL/min (ref >60)
Glucose, Bld: 105 mg/dL — ABNORMAL HIGH (ref 70–99)
Potassium: 3.9 mmol/L (ref 3.5–5.1)
Sodium: 137 mmol/L (ref 135–145)

## 2022-02-26 LAB — TROPONIN I (HIGH SENSITIVITY): Troponin I (High Sensitivity): <2 ng/L (ref <18)

## 2022-02-26 LAB — POC URINE PREG, ED: Preg Test, Ur: NEGATIVE

## 2022-02-26 NOTE — ED Provider Triage Note (Signed)
Emergency Medicine Provider Triage Evaluation Note ? ?Amanda [name] , a 26 y.o. female  was evaluated in triage.  Pt complains of sudden onset chest pain rating to back yesterday.  Patient also complaining today of bilateral arm numbness while she was driving.  Patient states chest pain is worse when she takes of breath or lies backwards.  Patient states she takes appetite suppressant pill however does not believe this is contributing. ? ?Review of Systems  ?Positive:  ?Negative:  ? ?Physical Exam  ?BP 127/90 (BP Location: Right Arm)   Pulse 78   Temp 98.4 ?F (36.9 ?C) (Oral)   Resp 20   Ht 5\' 9"  (1.753 m)   Wt 116.1 kg   LMP 02/21/2022 (Exact Date)   SpO2 100%   BMI 37.80 kg/m?  ?Gen:   Awake, no distress   ?Resp:  Normal effort  ?MSK:   Moves extremities without difficulty  ?Other:  Lungs clear.  Neurologically intact. ? ?Medical Decision Making  ?Medically screening exam initiated at 1:04 PM.  Appropriate orders placed.  Amanda [name] was informed that the remainder of the evaluation will be completed by another provider, this initial triage assessment does not replace that evaluation, and the importance of remaining in the ED until their evaluation is complete. ? ? ?  ?02/23/2022, PA-C ?02/26/22 1305 ? ?

## 2022-02-26 NOTE — ED Triage Notes (Addendum)
Pt reports mild chest pain radiating to her back yesterday and today while she was driving her arms started going numb and states she could not control her face, lasting about 15 mins. Currently she reports some sharp pain in her chest when she takes in a deep breath. Pt states she is taking an appetite suppressant pill ?

## 2022-02-26 NOTE — Discharge Instructions (Addendum)
You have been seen and discharged from the emergency department.  Your cardiac work-up was negative.  The CT of your head was normal.  Follow-up with your primary provider for further evaluation and further care.  You may establish care with neurology for further evaluation left facial twitching/extremity symptoms.  Take home medications as prescribed. If you have any worsening symptoms or further concerns for your health please return to an emergency department for further evaluation. ?

## 2022-02-26 NOTE — ED Provider Notes (Signed)
?[facility] EMERGENCY DEPARTMENT ?Provider Note ? ? ?CSN: [id number] ?Arrival date & time: 02/26/22  1206 ? ?  ? ?History ? ?Chief Complaint  ?Patient presents with  ? Chest Pain  ? Back Pain  ? Numbness  ? ? ?Amanda [name] is a 26 y.o. female. ? ?HPI ? ?26 year old female presents to the emergency department with multiple complaints.  Her initial complaint is that for the past 2 days she been having left-sided chest pain that sometimes wraps around to her left back.  Worse with certain movements.  No noted rash.  No recent fever, illness or cough.  Currently she is chest/back pain-free.  No risk factors or history of DVT/PE.  Her other complaint is that today while driving she feels like she lost control of her face that she had tingling of her bilateral upper extremities.  Patient states she started to notice a tightness in her face, her mouth was pursing and twitching and then she developed tingling of her bilateral upper extremities.  She states that this lasted about 15 minutes, then resolved.  No ongoing symptoms.  No noted facial droop, vision changes, focal weakness, seizure-like activity.  She is never had an episode like this before.  No headache or neck pain. ? ?Home Medications ?Prior to Admission medications   ?Medication Sig Start Date End Date Taking? Authorizing Provider  ?linaclotide Karlene Einstein) 72 MCG capsule Take 1 capsule (72 mcg total) by mouth daily before breakfast. 12/20/21   Imogene Burn, MD  ?MAGNESIUM PO Take by mouth.    [provider]  ?phentermine 37.5 MG capsule Take 37.5 mg by mouth every morning.    [provider]  ?polyethylene glycol (MIRALAX / GLYCOLAX) 17 g packet Take 17 g by mouth daily.    [provider]  ?   ? ?Allergies    ?Patient has no known allergies.   ? ?Review of Systems   ?Review of Systems  ?Constitutional:  Negative for fever.  ?Eyes:  Negative for visual disturbance.  ?Respiratory:  Negative for shortness of breath.    ?Cardiovascular:  Positive for chest pain.  ?Gastrointestinal:  Negative for abdominal pain, diarrhea and vomiting.  ?Musculoskeletal:  Positive for back pain.  ?Skin:  Negative for rash.  ?Neurological:  Positive for headaches. Negative for facial asymmetry, speech difficulty, weakness and numbness.  ?     Tingling  ?Psychiatric/Behavioral:  The patient is nervous/anxious.   ? ?Physical Exam ?Updated Vital Signs ?BP 125/76 (BP Location: Left Arm)   Pulse 76   Temp 98.4 ?F (36.9 ?C) (Oral)   Resp 18   Ht 5\' 9"  (1.753 m)   Wt 116.1 kg   LMP 02/21/2022 (Exact Date)   SpO2 100%   BMI 37.80 kg/m?  ?Physical Exam ?Vitals and nursing note reviewed.  ?Constitutional:   ?   General: She is not in acute distress. ?   Appearance: Normal appearance. She is not ill-appearing.  ?HENT:  ?   Head: Normocephalic.  ?   Mouth/Throat:  ?   Mouth: Mucous membranes are moist.  ?Eyes:  ?   Extraocular Movements: Extraocular movements intact.  ?   Pupils: Pupils are equal, round, and reactive to light.  ?Cardiovascular:  ?   Rate and Rhythm: Normal rate.  ?Pulmonary:  ?   Effort: Pulmonary effort is normal. No respiratory distress.  ?   Breath sounds: No decreased breath sounds.  ?Abdominal:  ?   Palpations: Abdomen is soft.  ?  Tenderness: There is no abdominal tenderness.  ?Skin: ?   General: Skin is warm.  ?Neurological:  ?   General: No focal deficit present.  ?   Mental Status: She is alert and oriented to person, place, and time. Mental status is at baseline.  ?   Cranial Nerves: No cranial nerve deficit.  ?Psychiatric:     ?   Mood and Affect: Mood normal.  ? ? ?ED Results / Procedures / Treatments   ?Labs ?(all labs ordered are listed, but only abnormal results are displayed) ?Labs Reviewed  ?BASIC METABOLIC PANEL - Abnormal; Notable for the following components:  ?    Result Value  ? Glucose, Bld 105 (*)   ? All other components within normal limits  ?CBC WITH DIFFERENTIAL/PLATELET  ?POC URINE PREG, ED  ?TROPONIN I  (HIGH SENSITIVITY)  ? ? ?EKG ?None ? ?Radiology ?DG Chest 2 View ? ?Result Date: 02/26/2022 ?CLINICAL DATA:  Mid chest pain radiating to back since yesterday, numbness in arms while driving today, unable to control her face, some sharp chest pain EXAM: CHEST - 2 VIEW COMPARISON:  05/29/2021 FINDINGS: Normal heart size, mediastinal contours, and pulmonary vascularity. Minimal chronic peribronchial thickening. Lungs otherwise clear. No infiltrate, pleural effusion, or pneumothorax. Osseous structures unremarkable. IMPRESSION: No acute abnormalities. Electronically Signed   By: Ulyses SouthwardMark  Boles M.D.   On: 02/26/2022 13:23  ? ?CT Head Wo Contrast ? ?Result Date: 02/26/2022 ?CLINICAL DATA:  Seizure EXAM: CT HEAD WITHOUT CONTRAST TECHNIQUE: Contiguous axial images were obtained from the base of the skull through the vertex without intravenous contrast. RADIATION DOSE REDUCTION: This exam was performed according to the departmental dose-optimization program which includes automated exposure control, adjustment of the mA and/or kV according to patient size and/or use of iterative reconstruction technique. COMPARISON:  None. FINDINGS: Brain: No acute territorial infarction, hemorrhage or intracranial mass. The ventricles are nonenlarged Vascular: No hyperdense vessel or unexpected calcification. Skull: Normal. Negative for fracture or focal lesion. Sinuses/Orbits: No acute finding. Other: None IMPRESSION: Negative non contrasted CT appearance of the brain Electronically Signed   By: Jasmine PangKim  Fujinaga M.D.   On: 02/26/2022 18:09   ? ?Procedures ?Procedures  ? ? ?Medications Ordered in ED ?Medications - No data to display ? ?ED Course/ Medical Decision Making/ A&P ?  ?                        ?Medical Decision Making ?Amount and/or Complexity of Data Reviewed ?Radiology: ordered. ? ? ?26 year old female presents emergency department with 2 days of intermittent left-sided chest/back pain as well as an episode of facial twitching and tingling  in her bilateral upper extremities.  In regards to the chest/back pain there is no rash/shingles.  EKG shows no ischemic changes.  There is no tachycardia, she is PERC negative.  Blood work is reassuring, troponin is negative.  Low suspicion for ACS/PE/dissection. ? ?In regards to the facial twitching and tingling of her bilateral upper extremities.  CT of the head is unremarkable.  This does not appear to be focal or stroke like related.  Also does not seem to be focal seizure.  She had associated perioral tingling/numbness, possible anxiety component.  She has had resolved symptoms, no ongoing symptoms with no new neuro symptoms.  No head pain or neck pain.  No recent illness. ? ?Work-up is reassuring, no indication of acute/emergent illness or need for further evaluation.  Plan for outpatient follow-up with strict return to ED  precautions.  Patient at this time appears safe and stable for discharge and close outpatient follow up. Discharge plan and strict return to ED precautions discussed, patient verbalizes understanding and agreement. ? ? ? ? ? ? ? ? ? ?Final Clinical Impression(s) / ED Diagnoses ?Final diagnoses:  ?Chest pain, unspecified type  ?Facial twitching  ? ? ?Rx / DC Orders ?ED Discharge Orders   ? ? None  ? ?  ? ? ?  ?Rozelle Logan, DO ?02/26/22 2020 ? ?

## 2022-02-26 NOTE — ED Notes (Signed)
RN reviewed discharge instructions w/ pt. Follow up reviewed, pt had no further questions 

## 2022-03-06 ENCOUNTER — Encounter: Payer: Self-pay | Admitting: Neurology

## 2022-04-20 ENCOUNTER — Ambulatory Visit: Payer: Commercial Managed Care - HMO | Admitting: Neurology

## 2022-04-20 ENCOUNTER — Encounter: Payer: Self-pay | Admitting: Neurology

## 2022-04-20 VITALS — BP 115/74 | HR 88 | Ht 69.0 in | Wt 263.2 lb

## 2022-04-20 DIAGNOSIS — R29818 Other symptoms and signs involving the nervous system: Secondary | ICD-10-CM | POA: Diagnosis not present

## 2022-04-20 DIAGNOSIS — G514 Facial myokymia: Secondary | ICD-10-CM | POA: Diagnosis not present

## 2022-04-20 DIAGNOSIS — R200 Anesthesia of skin: Secondary | ICD-10-CM | POA: Diagnosis not present

## 2022-05-04 ENCOUNTER — Ambulatory Visit (HOSPITAL_COMMUNITY): Payer: Commercial Managed Care - HMO

## 2022-05-06 ENCOUNTER — Other Ambulatory Visit: Payer: Commercial Managed Care - HMO

## 2022-05-21 ENCOUNTER — Encounter: Payer: Self-pay | Admitting: Internal Medicine

## 2022-07-26 ENCOUNTER — Ambulatory Visit: Payer: 59 | Admitting: Neurology

## 2022-09-04 ENCOUNTER — Telehealth: Payer: Self-pay | Admitting: Neurology

## 2022-09-04 NOTE — Telephone Encounter (Signed)
Called to sch a EEG for patient  08-27-22 LMOM  08-30-22 LMOM   09-04-22 LMOM  

## 2023-04-21 ENCOUNTER — Encounter (HOSPITAL_BASED_OUTPATIENT_CLINIC_OR_DEPARTMENT_OTHER): Payer: Self-pay

## 2023-04-21 ENCOUNTER — Other Ambulatory Visit: Payer: Self-pay

## 2023-04-21 ENCOUNTER — Emergency Department (HOSPITAL_BASED_OUTPATIENT_CLINIC_OR_DEPARTMENT_OTHER)
Admission: EM | Admit: 2023-04-21 | Discharge: 2023-04-21 | Disposition: A | Discharge status: Home / Self Care | Payer: Commercial Managed Care - HMO | Attending: Emergency Medicine | Admitting: Emergency Medicine

## 2023-04-21 DIAGNOSIS — W57XXXA Bitten or stung by nonvenomous insect and other nonvenomous arthropods, initial encounter: Secondary | ICD-10-CM | POA: Insufficient documentation

## 2023-04-21 DIAGNOSIS — S80862A Insect bite (nonvenomous), left lower leg, initial encounter: Secondary | ICD-10-CM | POA: Insufficient documentation

## 2023-04-21 DIAGNOSIS — S80861A Insect bite (nonvenomous), right lower leg, initial encounter: Secondary | ICD-10-CM | POA: Insufficient documentation

## 2023-04-21 DIAGNOSIS — R21 Rash and other nonspecific skin eruption: Secondary | ICD-10-CM | POA: Insufficient documentation

## 2023-04-21 NOTE — ED Triage Notes (Signed)
In for eval of pruritic rash/insect bites to bilateral legs, left hand, and face. Onset Wednesday.

## 2023-04-21 NOTE — Discharge Instructions (Signed)
You were evaluated today for a rash on your lower legs.  This may be related to insect bites.  As discussed I recommend trying Zyrtec to help with the itching along with topical Benadryl or calamine lotion.  If you develop fever or systemic symptoms you may return to the emergency department for further evaluation.  I also recommend contacting the number within the paperwork for help establishing care with a primary care provider in this area.

## 2023-04-21 NOTE — ED Provider Notes (Signed)
[place] EMERGENCY DEPARTMENT AT [facility] Provider Note   CSN: [id number] Arrival date & time: 04/21/23  4098     History  Chief Complaint  Patient presents with   Rash    Amanda [name] is a 27 y.o. female.  Patient presents to the emergency department complaining of a pruritic rash thought to be possibly insect bites on bilateral lower legs.  She also has 1 spot on the left hand that is also pruritic.  She states these began and noticed on Wednesday.  She denies any other areas of rash/pruritus.  She does have a cat in the home and lives in apartment complex.  She is concerned and would like to know these could be bedbug bites.  She denies fevers, nausea, vomiting, shortness of breath, other systemic symptoms.  HPI     Home Medications Prior to Admission medications   Medication Sig Start Date End Date Taking? Authorizing Provider  MAGNESIUM PO Take by mouth.   Yes [provider]  linaclotide Karlene Einstein) 72 MCG capsule Take 1 capsule (72 mcg total) by mouth daily before breakfast. 12/20/21   Imogene Burn, MD  phentermine 37.5 MG capsule Take 37.5 mg by mouth every morning. Patient not taking: Reported on 04/20/2022    [provider]  polyethylene glycol (MIRALAX / GLYCOLAX) 17 g packet Take 17 g by mouth daily. Patient not taking: Reported on 04/20/2022    [provider]      Allergies    Patient has no known allergies.    Review of Systems   Review of Systems  Physical Exam Updated Vital Signs BP (!) 144/91 (BP Location: Right Arm)   Pulse 73   Temp 98.2 F (36.8 C) (Oral)   Resp 17   Ht 5\' 9"  (1.753 m)   Wt 122.5 kg   SpO2 98%   BMI 39.87 kg/m  Physical Exam Vitals and nursing note reviewed.  HENT:     Head: Normocephalic and atraumatic.  Eyes:     Pupils: Pupils are equal, round, and reactive to light.  Pulmonary:     Effort: Pulmonary effort is normal. No respiratory distress.  Musculoskeletal:        General:  No signs of injury.     Cervical back: Normal range of motion.  Skin:    General: Skin is dry.     Findings: Rash present.     Comments: Multiple small lesions on lower legs, mostly below the knees bilaterally.  Papules, some with scabbing likely from scratching.  No drainage or purulence noted.  No surrounding erythema.  See image below  Neurological:     Mental Status: She is alert.  Psychiatric:        Speech: Speech normal.        Behavior: Behavior normal.     ED Results / Procedures / Treatments   Labs (all labs ordered are listed, but only abnormal results are displayed) Labs Reviewed - No data to display  EKG None  Radiology No results found.  Procedures Procedures    Medications Ordered in ED Medications - No data to display  ED Course/ Medical Decision Making/ A&P                             Medical Decision Making  Patient presents to the emergency department the chief complaint rash/insect bite.  Differential diagnosis includes dermatitis, insect bites, abscess, cellulitis, others  The  patient has no comorbidities.  There are no relevant documents for review  Based on the appearance of the patient's lesions these do appear consistent with bug bites but I am unable to tell the patient what type of bite they may be.  I recommended to the patient to check her environment for signs of bedbugs, fleas, and other insects.  No drainable abscess noted.  No signs of cellulitis at this time.  Rash is almost entirely on lower legs. I recommended the patient try Zyrtec for pruritus and try topical Benadryl.  She has been advised to keep the areas clean and to avoid scratching them.  The patient voices understanding with plan.  The patient currently has no primary care provider.  Ideally the patient will follow with primary care and if these areas do not improve she may need care with dermatology in the future.  No indication for admission at this time.  Discharge  home.        Final Clinical Impression(s) / ED Diagnoses Final diagnoses:  Rash and nonspecific skin eruption  Insect bite of lower leg, unspecified laterality, initial encounter    Rx / DC Orders ED Discharge Orders     None         Pamala Duffel 04/21/23 1056    Pricilla Loveless, MD 04/22/23 2317

## 2023-07-10 ENCOUNTER — Encounter: Payer: Self-pay | Admitting: Family Medicine

## 2023-07-10 ENCOUNTER — Ambulatory Visit (INDEPENDENT_AMBULATORY_CARE_PROVIDER_SITE_OTHER): Payer: 59 | Admitting: Family Medicine

## 2023-07-10 VITALS — BP 123/86 | HR 69 | Temp 98.4°F | Ht 69.0 in | Wt 279.0 lb

## 2023-07-10 DIAGNOSIS — R632 Polyphagia: Secondary | ICD-10-CM | POA: Diagnosis not present

## 2023-07-10 DIAGNOSIS — Z6841 Body Mass Index (BMI) 40.0 and over, adult: Secondary | ICD-10-CM | POA: Diagnosis not present

## 2023-07-10 DIAGNOSIS — Z0289 Encounter for other administrative examinations: Secondary | ICD-10-CM

## 2023-07-10 NOTE — Progress Notes (Signed)
Office: [phone]  /  Fax: [phone]   Initial Visit  Amanda [name] was seen in clinic today to evaluate for obesity. She is interested in losing weight to improve overall health and reduce the risk of weight related complications. She presents today to review program treatment options, initial physical assessment, and evaluation.     She was referred by: Self-Referral  When asked what else they would like to accomplish? She states: Adopt healthier eating patterns, Improve energy levels and physical activity, Improve quality of life, and Improve appearance  Weight history: she has been overweight since childhood.    When asked how has your weight affected you? She states: Contributed to medical problems, Contributed to orthopedic problems or mobility issues, Having fatigue, and Problems with eating patterns  Some associated conditions: None  Contributing factors: Stress, Reduced physical activity, and Eating patterns  Weight promoting medications identified: Contraceptives or hormonal therapy  Current nutrition plan: None  Current level of physical activity: Walking  Current or previous pharmacotherapy: Phentermine  Response to medication: Had side effects so it was discontinued   Past medical history includes:  History reviewed. No pertinent past medical history.   Objective:   BP 123/86   Pulse 69   Temp 98.4 F (36.9 C)   Ht 5\' 9"  (1.753 m)   Wt 279 lb (126.6 kg)   SpO2 97%   BMI 41.20 kg/m  She was weighed on the bioimpedance scale: Body mass index is 41.2 kg/m.  Peak Weight:301 , Body Fat%:45.5, Visceral Fat Rating:11, Weight trend over the last 12 months: Increasing  General:  Alert, oriented and cooperative. Patient is in no acute distress.  Respiratory: Normal respiratory effort, no problems with respiration noted   Gait: able to ambulate independently  Mental Status: Normal mood and affect. Normal behavior. Normal judgment and thought content.    DIAGNOSTIC DATA REVIEWED:  BMET    Component Value Date/Time   NA 137 02/26/2022 1310   K 3.9 02/26/2022 1310   CL 105 02/26/2022 1310   CO2 26 02/26/2022 1310   GLUCOSE 105 (H) 02/26/2022 1310   BUN 13 02/26/2022 1310   CREATININE 0.86 02/26/2022 1310   CALCIUM 9.4 02/26/2022 1310   GFRNONAA >60 02/26/2022 1310   GFRAA >60 05/29/2015 0645   No results found for: "HGBA1C" No results found for: "INSULIN" CBC    Component Value Date/Time   WBC 9.4 02/26/2022 1310   RBC 4.72 02/26/2022 1310   HGB 13.9 02/26/2022 1310   HCT 40.5 02/26/2022 1310   PLT 327 02/26/2022 1310   MCV 85.8 02/26/2022 1310   MCH 29.4 02/26/2022 1310   MCHC 34.3 02/26/2022 1310   RDW 12.7 02/26/2022 1310   Iron/TIBC/Ferritin/ %Sat No results found for: "IRON", "TIBC", "FERRITIN", "IRONPCTSAT" Lipid Panel  No results found for: "CHOL", "TRIG", "HDL", "CHOLHDL", "VLDL", "LDLCALC", "LDLDIRECT" Hepatic Function Panel     Component Value Date/Time   PROT 7.6 12/20/2021 1002   ALBUMIN 4.3 12/20/2021 1002   AST 12 12/20/2021 1002   ALT 12 12/20/2021 1002   ALKPHOS 43 12/20/2021 1002   BILITOT 0.4 12/20/2021 1002      Component Value Date/Time   TSH 1.49 12/20/2021 1002     Assessment and Plan:   Polyphagia Assessment & Plan: She c/o increased appetite, increased snacking and weight gain over the past year.  She did see weight gain coming off of phentermine last year  We discussed the importance of eating meals, protein and fiber with  meals along with adequate hydration.  Avoid use of generic Phentermine in the future given her hx of side effects   Obesity, Class III, BMI 40-49.9 (morbid obesity) (HCC)  BMI 40.0-44.9, adult (HCC)        Obesity Treatment / Action Plan:  Patient will work on garnering support from family and friends to begin weight loss journey. Will work on eliminating or reducing the presence of highly palatable, calorie dense foods in the home. Will complete  provided nutritional and psychosocial assessment questionnaire before the next appointment. Will be scheduled for indirect calorimetry to determine resting energy expenditure in a fasting state.  This will allow [place] to create a reduced calorie, high-protein meal plan to promote loss of fat mass while preserving muscle mass. Will think about ideas on how to incorporate physical activity into their daily routine. Will avoid skipping meals which may result in increased hunger signals and overeating at certain times. Counseled on the health benefits of losing 5%-15% of total body weight. Will work on improving sleep hygiene and trying to obtain at least 7 hours of sleep. Was counseled on nutritional approaches to weight loss and benefits of reducing processed foods and consuming plant-based foods and high quality protein as part of nutritional weight management. Was counseled on pharmacotherapy and role as an adjunct in weight management.   Obesity Education Performed Today:  She was weighed on the bioimpedance scale and results were discussed and documented in the synopsis.  We discussed obesity as a disease and the importance of a more detailed evaluation of all the factors contributing to the disease.  We discussed the importance of long term lifestyle changes which include nutrition, exercise and behavioral modifications as well as the importance of customizing this to her specific health and social needs.  We discussed the benefits of reaching a healthier weight to alleviate the symptoms of existing conditions and reduce the risks of the biomechanical, metabolic and psychological effects of obesity.  Karlyn J Bua appears to be in the action stage of change and states they are ready to start intensive lifestyle modifications and behavioral modifications.  30 minutes was spent today on this visit including the above counseling, pre-visit chart review, and post-visit documentation.  Reviewed  by clinician on day of visit: allergies, medications, problem list, medical history, surgical history, family history, social history, and previous encounter notes pertinent to obesity diagnosis.    Seymour Bars, D.O. DABFM, DABOM [facility] [phone] [place], [place] [address] [phone]

## 2023-07-10 NOTE — Assessment & Plan Note (Signed)
She c/o increased appetite, increased snacking and weight gain over the past year.  She did see weight gain coming off of phentermine last year  We discussed the importance of eating meals, protein and fiber with meals along with adequate hydration.  Avoid use of generic Phentermine in the future given her hx of side effects

## 2023-07-24 ENCOUNTER — Other Ambulatory Visit: Payer: Self-pay

## 2023-07-24 ENCOUNTER — Ambulatory Visit: Payer: 59 | Admitting: Family Medicine

## 2023-07-24 ENCOUNTER — Ambulatory Visit (HOSPITAL_COMMUNITY)
Admission: RE | Admit: 2023-07-24 | Discharge: 2023-07-24 | Disposition: A | Discharge status: Home / Self Care | Payer: 59 | Source: Ambulatory Visit | Attending: Family Medicine | Admitting: Family Medicine

## 2023-07-24 ENCOUNTER — Encounter: Payer: Self-pay | Admitting: Family Medicine

## 2023-07-24 VITALS — BP 119/83 | HR 61 | Temp 97.8°F | Ht 69.0 in | Wt 278.0 lb

## 2023-07-24 DIAGNOSIS — R632 Polyphagia: Secondary | ICD-10-CM | POA: Diagnosis not present

## 2023-07-24 DIAGNOSIS — R5383 Other fatigue: Secondary | ICD-10-CM | POA: Diagnosis present

## 2023-07-24 DIAGNOSIS — K5909 Other constipation: Secondary | ICD-10-CM | POA: Diagnosis not present

## 2023-07-24 DIAGNOSIS — F3289 Other specified depressive episodes: Secondary | ICD-10-CM | POA: Diagnosis not present

## 2023-07-24 DIAGNOSIS — F32A Depression, unspecified: Secondary | ICD-10-CM | POA: Insufficient documentation

## 2023-07-24 DIAGNOSIS — Z6841 Body Mass Index (BMI) 40.0 and over, adult: Secondary | ICD-10-CM

## 2023-07-24 DIAGNOSIS — R0602 Shortness of breath: Secondary | ICD-10-CM

## 2023-07-24 DIAGNOSIS — Z1331 Encounter for screening for depression: Secondary | ICD-10-CM | POA: Diagnosis not present

## 2023-07-24 NOTE — Progress Notes (Signed)
Chief Complaint:   OBESITY Amanda [name] (MR# [id number]) is a 27 y.o. female who presents for evaluation and treatment of obesity and related comorbidities. Current BMI is Body mass index is 41.05 kg/m. Amanda [name] has been struggling with her weight for many years and has been unsuccessful in either losing weight, maintaining weight loss, or reaching her healthy weight goal.  Amanda [name] is currently in the action stage of change and ready to dedicate time achieving and maintaining a healthier weight. Amanda [name] is interested in becoming our patient and working on intensive lifestyle modifications including (but not limited to) diet and exercise for weight loss.  Patient works as a Web designer.  She is engaged to [place].  She skips meals, likes sweet tea and coffee drinks.  She exercises inconsistently.  She averages 3,000 steps per day.  She started to gain weight 4 years ago, but she was overweight in her childhood.   Amanda [name]'s habits were reviewed today and are as follows: Her family eats meals together, she thinks her family will eat healthier with her, her desired weight loss is 88 lbs, she has been heavy most of her life, she started gaining excessive weight in 2020, her heaviest weight ever was 301 pounds, she has significant food cravings issues, she snacks frequently in the evenings, she skips meals frequently, she is frequently drinking liquids with calories, she frequently makes poor food choices, she has problems with excessive hunger, she frequently eats larger portions than normal, and she struggles with emotional eating.  Depression Screen Amanda [name]'s Food and Mood (modified PHQ-9) score was 21.  Subjective:   1. Other fatigue [name] admits to daytime somnolence and denies waking up still tired. Patient has a history of symptoms of daytime fatigue. [name] generally gets 6 or 7 hours of sleep per night, and states that she has generally restful  sleep. Snoring is not present. Apneic episodes are present. Epworth Sleepiness Score is 3.  EKG-normal sinus rhythm at 75 bpm without ischemia.  2. SOBOE (shortness of breath on exertion) Amanda [name] notes increasing shortness of breath with exercising and seems to be worsening over time with weight gain. She notes getting out of breath sooner with activity than she used to. This has not gotten worse recently. Amanda [name] denies shortness of breath at rest or orthopnea.  3. Other constipation Patient takes magnesium supplementation as needed.  4. Polyphagia Patient's polyphagia is worsened by skipping breakfast and an adequate intake of lean protein and fiber.  5. Other depression with emotional eating Patient's bariatric PHQ-9 score is 21.  She was previously taking phentermine 1/2 tablet and had an episode of facial twitching.  Assessment/Plan:   1. Other fatigue [name] does feel that her weight is causing her energy to be lower than it should be. Fatigue may be related to obesity, depression or many other causes. Labs will be ordered, and in the meanwhile, Babbette will focus on self care including making healthy food choices, increasing physical activity and focusing on stress reduction.  - EKG 12-Lead - VITAMIN D 25 Hydroxy (Vit-D Deficiency, Fractures) - TSH - T4, free - T3 - Lipid panel - Insulin, random - Hemoglobin A1c - Folate - Comprehensive metabolic panel - Vitamin B12 - CBC with Differential/Platelet  2. SOBOE (shortness of breath on exertion) Amanda [name] does feel that she gets out of breath more easily that she used to when she exercises. Amanda [name]'s shortness of breath appears to be obesity related and exercise induced.  She has agreed to work on weight loss and gradually increase exercise to treat her exercise induced shortness of breath. Will continue to monitor closely.  3. Other constipation Patient is to increase her intake of water to 90+ ounces per day and increase dietary  fiber with veggies.  4. Polyphagia Patient is to increase her intake of lean protein and fiber with meals.  Do not skip meals, and get in 90+ ounces of water daily.  5. Other depression with emotional eating Consider the use of Contrave or Wellbutrin.  Patient will work on stress reduction and mindful eating.  6. Depression screen Amanda [name] had a positive depression screening. Depression is commonly associated with obesity and often results in emotional eating behaviors. We will monitor this closely and work on CBT to help improve the non-hunger eating patterns. Referral to Psychology may be required if no improvement is seen as she continues in our clinic.  7. BMI 40.0-44.9, adult (HCC)  8. Morbid obesity with starting BMI 41.2 Amanda [name] is currently in the action stage of change and her goal is to continue with weight loss efforts. I recommend Amanda [name] begin the structured treatment plan as follows:  She has agreed to the Category 3 Plan with 100+ grams of protein daily.  100-calorie snack list was given.  Discussed swap out for sweet coffee drinks.  Exercise goals: All adults should avoid inactivity. Some physical activity is better than none, and adults who participate in any amount of physical activity gain some health benefits.   Behavioral modification strategies: increasing lean protein intake, increasing vegetables, increasing water intake, increasing high fiber foods, decreasing eating out, no skipping meals, meal planning and cooking strategies, keeping healthy foods in the home, better snacking choices, and planning for success.  She was informed of the importance of frequent follow-up visits to maximize her success with intensive lifestyle modifications for her multiple health conditions. She was informed we would discuss her lab results at her next visit unless there is a critical issue that needs to be addressed sooner. Ronald agreed to keep her next visit at the agreed upon time to  discuss these results.  Objective:   Blood pressure 119/83, pulse 61, temperature 97.8 F (36.6 C), height 5\' 9"  (1.753 m), weight 278 lb (126.1 kg), SpO2 97%. Body mass index is 41.05 kg/m.  EKG: Normal sinus rhythm, rate 75 BPM.  Indirect Calorimeter completed today shows a VO2 of 309 and a REE of 2131.  Her calculated basal metabolic rate is 1914 thus her basal metabolic rate is worse than expected.  General: Cooperative, alert, well developed, in no acute distress. HEENT: Conjunctivae and lids unremarkable. Cardiovascular: Regular rhythm.  Lungs: Normal work of breathing. Neurologic: No focal deficits.   Lab Results  Component Value Date   CREATININE 0.86 02/26/2022   BUN 13 02/26/2022   NA 137 02/26/2022   K 3.9 02/26/2022   CL 105 02/26/2022   CO2 26 02/26/2022   Lab Results  Component Value Date   ALT 12 12/20/2021   AST 12 12/20/2021   ALKPHOS 43 12/20/2021   BILITOT 0.4 12/20/2021   No results found for: "HGBA1C" No results found for: "INSULIN" Lab Results  Component Value Date   TSH 1.49 12/20/2021   No results found for: "CHOL", "HDL", "LDLCALC", "LDLDIRECT", "TRIG", "CHOLHDL" Lab Results  Component Value Date   WBC 9.4 02/26/2022   HGB 13.9 02/26/2022   HCT 40.5 02/26/2022   MCV 85.8 02/26/2022   PLT 327 02/26/2022  No results found for: "IRON", "TIBC", "FERRITIN"  Attestation Statements:   Reviewed by clinician on day of visit: allergies, medications, problem list, medical history, surgical history, family history, social history, and previous encounter notes.  Time spent on visit including pre-visit chart review and post-visit charting and care was 40 minutes.   Trude Mcburney, am acting as transcriptionist for Seymour Bars, DO.  I have reviewed the above documentation for accuracy and completeness, and I agree with the above. Seymour Bars DO

## 2023-07-25 LAB — COMPREHENSIVE METABOLIC PANEL
ALT: 37 IU/L — ABNORMAL HIGH (ref 0–32)
AST: 35 IU/L (ref 0–40)
Albumin: 4.3 g/dL (ref 4.0–5.0)
Alkaline Phosphatase: 54 IU/L (ref 44–121)
BUN/Creatinine Ratio: 15 (ref 9–23)
BUN: 13 mg/dL (ref 6–20)
Bilirubin Total: 0.4 mg/dL (ref 0.0–1.2)
CO2: 23 mmol/L (ref 20–29)
Calcium: 9.6 mg/dL (ref 8.7–10.2)
Chloride: 101 mmol/L (ref 96–106)
Creatinine, Ser: 0.87 mg/dL (ref 0.57–1.00)
Globulin, Total: 3.1 g/dL (ref 1.5–4.5)
Glucose: 99 mg/dL (ref 70–99)
Potassium: 4.6 mmol/L (ref 3.5–5.2)
Sodium: 138 mmol/L (ref 134–144)
Total Protein: 7.4 g/dL (ref 6.0–8.5)
eGFR: 94 mL/min/{1.73_m2} (ref >59)

## 2023-07-25 LAB — LIPID PANEL
Chol/HDL Ratio: 3.3 ratio (ref 0.0–4.4)
Cholesterol, Total: 174 mg/dL (ref 100–199)
HDL: 52 mg/dL (ref >39)
LDL Chol Calc (NIH): 95 mg/dL (ref 0–99)
Triglycerides: 157 mg/dL — ABNORMAL HIGH (ref 0–149)
VLDL Cholesterol Cal: 27 mg/dL (ref 5–40)

## 2023-07-25 LAB — CBC WITH DIFFERENTIAL/PLATELET
Basophils Absolute: 0.1 10*3/uL (ref 0.0–0.2)
Basos: 1 %
EOS (ABSOLUTE): 0.1 10*3/uL (ref 0.0–0.4)
Eos: 1 %
Hematocrit: 44.9 % (ref 34.0–46.6)
Hemoglobin: 14.4 g/dL (ref 11.1–15.9)
Immature Grans (Abs): 0 10*3/uL (ref 0.0–0.1)
Immature Granulocytes: 0 %
Lymphocytes Absolute: 2.7 10*3/uL (ref 0.7–3.1)
Lymphs: 31 %
MCH: 28.4 pg (ref 26.6–33.0)
MCHC: 32.1 g/dL (ref 31.5–35.7)
MCV: 89 fL (ref 79–97)
Monocytes Absolute: 0.6 10*3/uL (ref 0.1–0.9)
Monocytes: 7 %
Neutrophils Absolute: 5.4 10*3/uL (ref 1.4–7.0)
Neutrophils: 60 %
Platelets: 256 10*3/uL (ref 150–450)
RBC: 5.07 x10E6/uL (ref 3.77–5.28)
RDW: 13.3 % (ref 11.7–15.4)
WBC: 8.9 10*3/uL (ref 3.4–10.8)

## 2023-07-25 LAB — T4, FREE: Free T4: 1.24 ng/dL (ref 0.82–1.77)

## 2023-07-25 LAB — VITAMIN B12: Vitamin B-12: 848 pg/mL (ref 232–1245)

## 2023-07-25 LAB — VITAMIN D 25 HYDROXY (VIT D DEFICIENCY, FRACTURES): Vit D, 25-Hydroxy: 27 ng/mL — ABNORMAL LOW (ref 30.0–100.0)

## 2023-07-25 LAB — INSULIN, RANDOM: INSULIN: 12.4 u[IU]/mL (ref 2.6–24.9)

## 2023-07-25 LAB — FOLATE: Folate: 12 ng/mL (ref >3.0)

## 2023-07-25 LAB — T3: T3, Total: 107 ng/dL (ref 71–180)

## 2023-07-25 LAB — HEMOGLOBIN A1C
Est. average glucose Bld gHb Est-mCnc: 111 mg/dL
Hgb A1c MFr Bld: 5.5 % (ref 4.8–5.6)

## 2023-07-25 LAB — TSH: TSH: 2.42 u[IU]/mL (ref 0.450–4.500)

## 2023-07-28 IMAGING — CT CT HEAD W/O CM
3 of 4 series · 15 of 47 positions shown, 18 images · non-contrast
Comparison: None.

CLINICAL DATA: Seizure



[Series 5: head 5.0 h30s · axial · 0.49mm/px · z∈[-108,+17]mm · 9 of 33 slices shown, 12 images]
[im 4/33  brain]
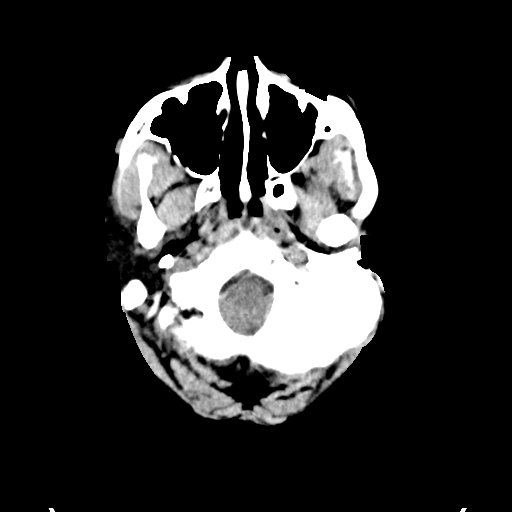
[im 4/33  bone]
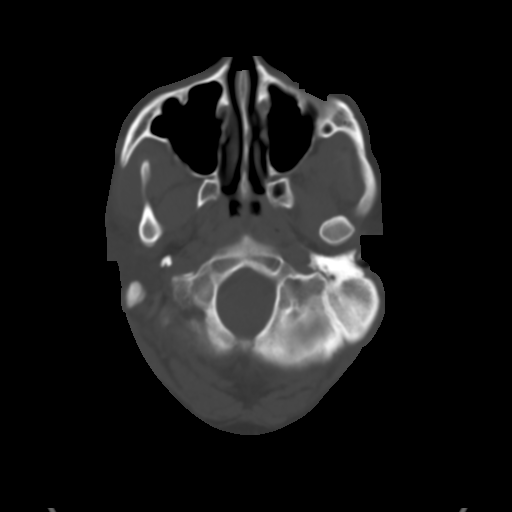
[im 7/33  brain]
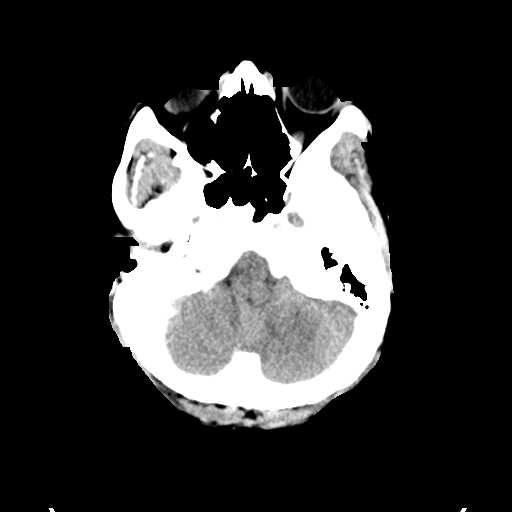
[im 10/33  brain]
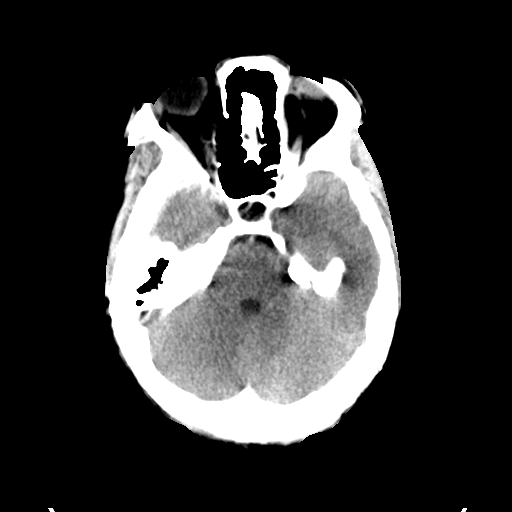
[im 13/33  brain]
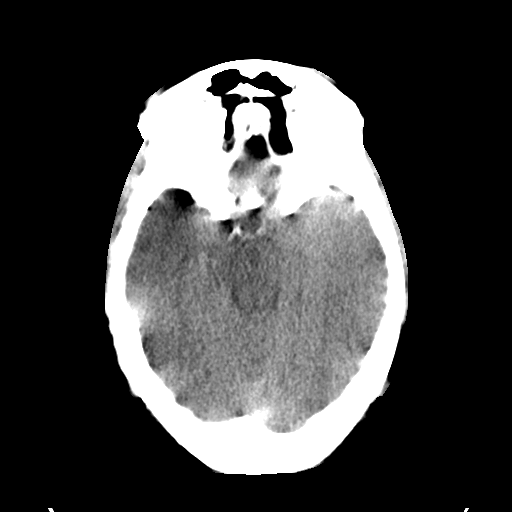
[im 17/33  brain]
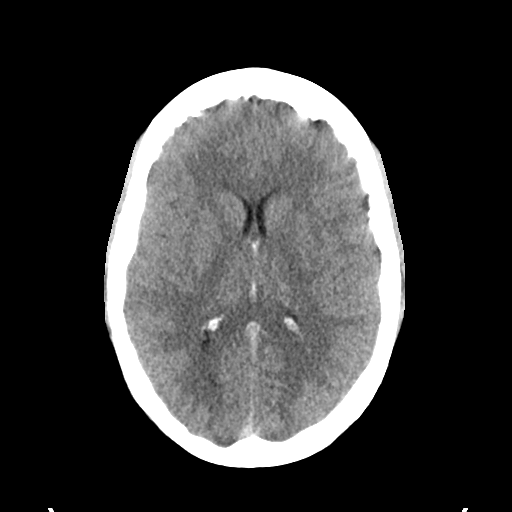
[im 17/33  bone]
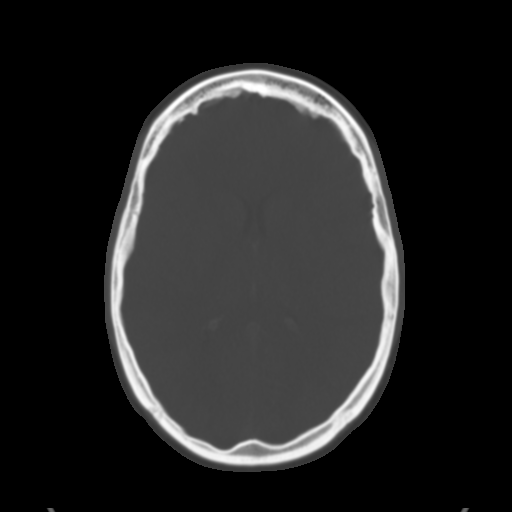
[im 20/33  brain]
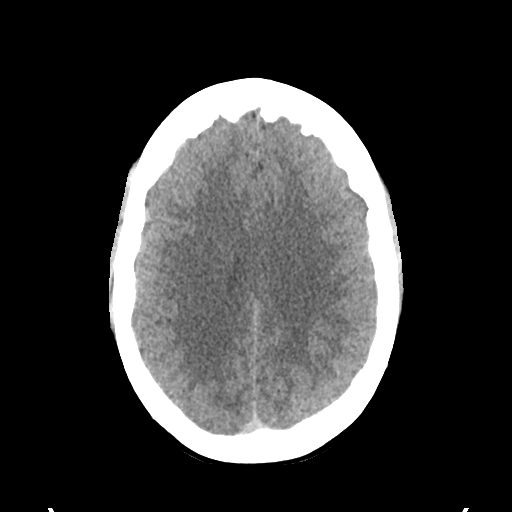
[im 23/33  brain]
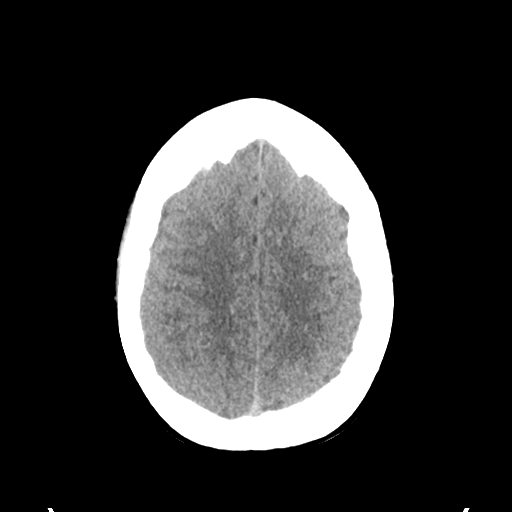
[im 26/33  brain]
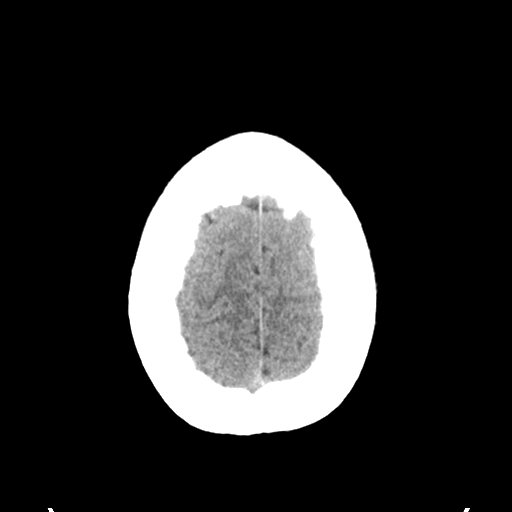
[im 29/33  brain]
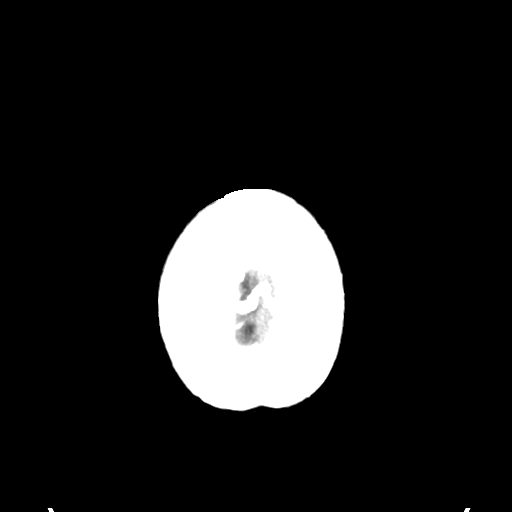
[im 29/33  bone]
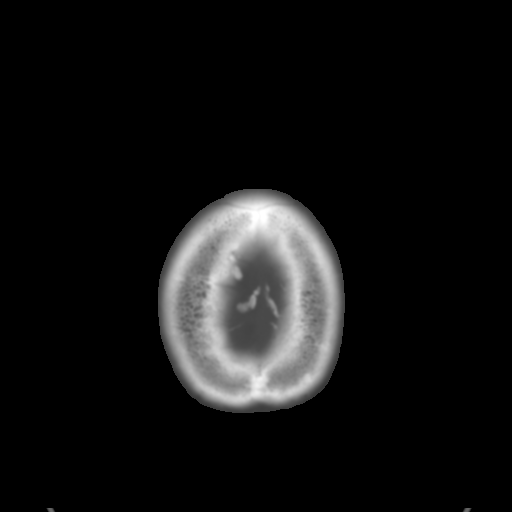

[Series 6: head 3.0 mpr cor · coronal · 0.33mm/px · 3 of 71 slices shown]
[im 24/71  brain]
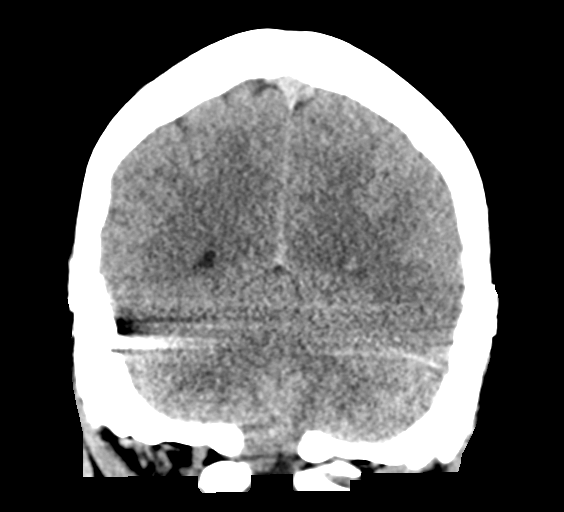
[im 32/71  brain]
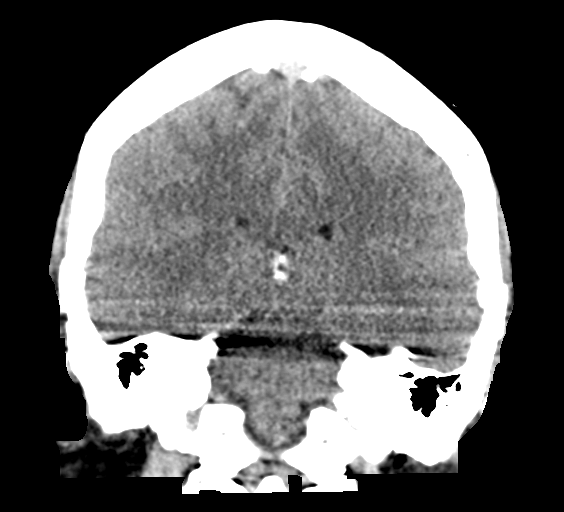
[im 39/71  brain]
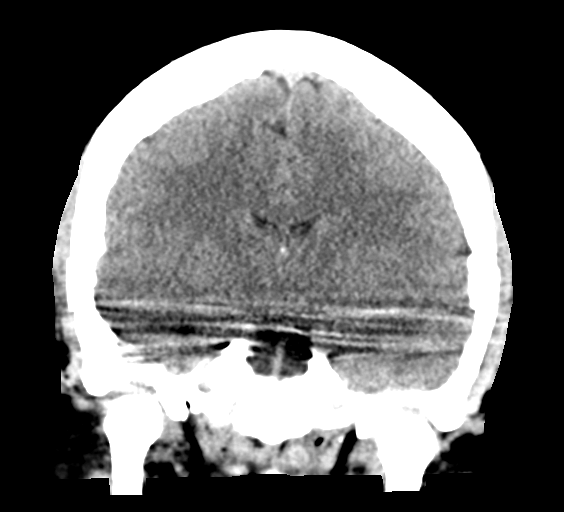

[Series 7: head 3.0 mpr sag · sagittal · 0.33mm/px · 3 of 59 slices shown]
[im 20/59  brain]
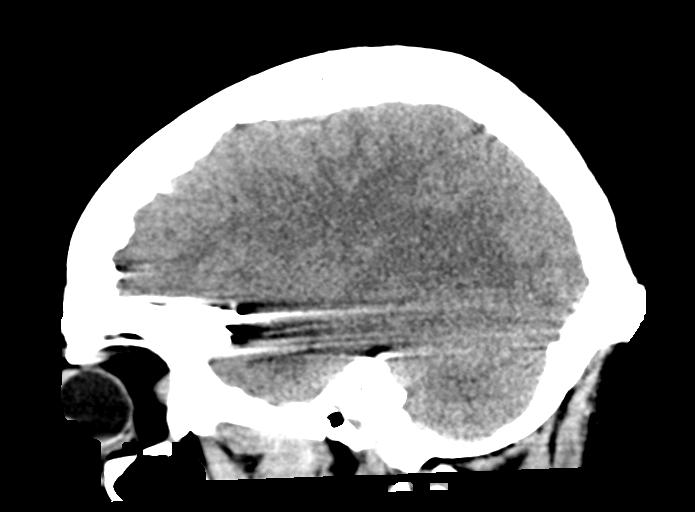
[im 30/59  brain]
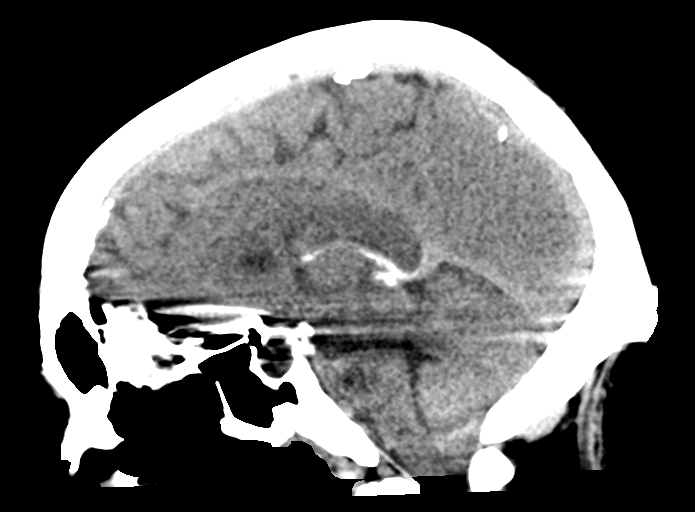
[im 39/59  brain]
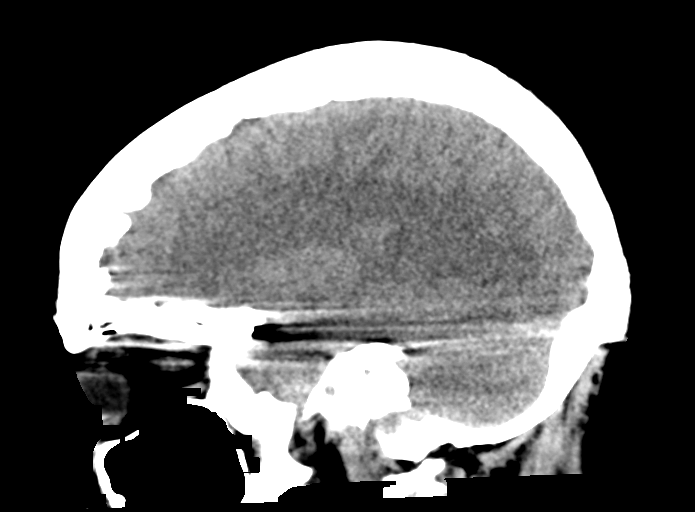

[15 of 47 positions shown; findings below may reference images not displayed]

FINDINGS: Brain: No acute territorial infarction, hemorrhage or intracranial
mass. The ventricles are nonenlarged

Vascular: No hyperdense vessel or unexpected calcification.

Skull: Normal. Negative for fracture or focal lesion.

Sinuses/Orbits: No acute finding.

Other: None
IMPRESSION: Negative non contrasted CT appearance of the brain

## 2023-08-07 ENCOUNTER — Ambulatory Visit: Payer: 59 | Admitting: Family Medicine

## 2023-08-07 ENCOUNTER — Telehealth: Payer: Self-pay | Admitting: *Deleted

## 2023-08-07 ENCOUNTER — Other Ambulatory Visit: Payer: Self-pay | Admitting: Family Medicine

## 2023-08-07 ENCOUNTER — Encounter: Payer: Self-pay | Admitting: Family Medicine

## 2023-08-07 VITALS — BP 130/82 | HR 73 | Temp 98.1°F | Ht 69.0 in | Wt 279.0 lb

## 2023-08-07 DIAGNOSIS — E559 Vitamin D deficiency, unspecified: Secondary | ICD-10-CM

## 2023-08-07 DIAGNOSIS — R7401 Elevation of levels of liver transaminase levels: Secondary | ICD-10-CM | POA: Diagnosis not present

## 2023-08-07 DIAGNOSIS — R632 Polyphagia: Secondary | ICD-10-CM

## 2023-08-07 DIAGNOSIS — E66813 Obesity, class 3: Secondary | ICD-10-CM | POA: Diagnosis not present

## 2023-08-07 DIAGNOSIS — E662 Morbid (severe) obesity with alveolar hypoventilation: Secondary | ICD-10-CM

## 2023-08-07 DIAGNOSIS — Z6841 Body Mass Index (BMI) 40.0 and over, adult: Secondary | ICD-10-CM

## 2023-08-07 MED ORDER — VITAMIN D (ERGOCALCIFEROL) 1.25 MG (50000 UNIT) PO CAPS: 50000.0000 [IU] | ORAL_CAPSULE | ORAL | 0 refills | Status: DC

## 2023-08-07 MED ORDER — SAXENDA 18 MG/3ML ~~LOC~~ SOPN: 0.6000 mg | PEN_INJECTOR | Freq: Every day | SUBCUTANEOUS | 0 refills | Status: DC

## 2023-08-07 MED ORDER — PEN NEEDLES 32G X 4 MM MISC: 0 refills | Status: DC

## 2023-08-07 NOTE — Assessment & Plan Note (Signed)
New. Reviewed ALT of 37.  Her liver enzymes have not previously been elevated.  She denies heavy intake of alcohol or Tylenol containing products.  She denies abdominal pain or change in urine color.  With her BMI of 41, this is likely fatty liver.  Reviewed check liver enzymes in the next 3 to 4 months.  If her ALT remains elevated, will obtain a liver ultrasound to look for hepatic steatosis.

## 2023-08-07 NOTE — Patient Instructions (Addendum)
You can substitute in a protein shake ( FairLife shake, Core Power shake, Premier Protein, OWYN, Pure Protein) + one serving of fresh fruit OR one slice of toast with peanut butter  OR a Location manager Delightful breakfast sandwich  2 slices of Terrill Mohr Delightful bread Substitution: Low carb tortilla 100 calorie Maisie Fus English Muffins  Snacks for work: [employer] String cheese Cottage cheese  [employer]  Allow 1/4 plate of STARCH with dinner - air fry/ baked potato (sweet or rice) - rice, quinoa, beans, Barilla Protein pasta  Goal: AM workouts 2 x a week  Start Saxenda at 0.6 mg  daily injection Check out [email or web address] Track your periods

## 2023-08-07 NOTE — Telephone Encounter (Signed)
Prior authorization done via cover my meds for patients Saxenda. Waiting on determination.

## 2023-08-07 NOTE — Progress Notes (Signed)
Office: [phone]  /  Fax: [phone]  WEIGHT SUMMARY AND BIOMETRICS  Starting Date: 07/24/23  Starting Weight: 278lb   Weight Lost Since Last Visit: 0lb   Vitals Temp: 98.1 F (36.7 C) BP: 130/82 Pulse Rate: 73 SpO2: 97 %   Body Composition  Body Fat %: 46.4 % Fat Mass (lbs): 129.6 lbs Muscle Mass (lbs): 142.2 lbs Total Body Water (lbs): 100 lbs Visceral Fat Rating : 11    HPI  Chief Complaint: OBESITY  Amanda [name] is here to discuss her progress with her obesity treatment plan. She is on the the Category 3 Plan and states she is following her eating plan approximately 50 % of the time. She states she is exercising 30 minutes 1-2 times per week.   Interval History:  Since last office visit she is up 1 lb She has had trouble to get into her schedule She goes into work at noon and would like to go to the gym in the morning She is trying to prep and bring lunch and dinner to work She gets off work at 9 pm Craving fries and chips She is feeling more hunger at the end of her meals She is making an effort to get in breakfast where she used to skip  Pharmacotherapy: None  PHYSICAL EXAM:  Blood pressure 130/82, pulse 73, temperature 98.1 F (36.7 C), height 5\' 9"  (1.753 m), weight 279 lb (126.6 kg), SpO2 97%. Body mass index is 41.2 kg/m.  General: She is overweight, cooperative, alert, well developed, and in no acute distress. PSYCH: Has normal mood, affect and thought process.   Lungs: Normal breathing effort, no conversational dyspnea.   ASSESSMENT AND PLAN  TREATMENT PLAN FOR OBESITY:  Recommended Dietary Goals  Cynda is currently in the action stage of change. As such, her goal is to continue weight management plan. She has agreed to the Category 3 Plan.  Behavioral Intervention  We discussed the following Behavioral Modification Strategies today: increasing lean protein intake to established goals, increasing lower glycemic fruits, increasing  fiber rich foods, avoiding skipping meals, increasing water intake , work on meal planning and preparation, keeping healthy foods at home, planning for success, and continue to work on maintaining a reduced calorie state, getting the recommended amount of protein, incorporating whole foods, making healthy choices, staying well hydrated and practicing mindfulness when eating..  Additional resources provided today: NA  Recommended Physical Activity Goals  Lavayah has been advised to work up to 150 minutes of moderate intensity aerobic activity a week and strengthening exercises 2-3 times per week for cardiovascular health, weight loss maintenance and preservation of muscle mass.   She has agreed to [employer] strengthening exercises with a goal of 2-3 sessions a week   Pharmacotherapy changes for the treatment of obesity: Begin Saxenda 0.6 mg once weekly injection  ASSOCIATED CONDITIONS ADDRESSED TODAY  Polyphagia Assessment & Plan: Despite getting in all 3 meals on her meal plan, she does complain of hunger and cravings for starchy foods and increased food volumes at the end of her meals.  She has previously had adverse side effects from phentermine.  We discussed increasing intake of water with a goal of a least 100 ounces per day and increasing intake of fruit to 2 servings per day, allowing plenty of nonstarchy vegetables and one quarter plate of a high fiber starch with dinner.  Begin Saxenda 0.6 mg once weekly injection.  We discussed mechanism of action and potential adverse side effects.  She will  use this along with a reduced calorie high-protein low carbohydrate diet, regular exercise and mindful eating.  She is currently not on birth control.  She agrees to using a period tracking app, using condoms as needed.  She denies a personal or family history for pancreatitis, multiple endocrine neoplasia type II or medullary thyroid carcinoma.  She will check out [email or web address] for pen training  video.  Orders: -     Saxenda; Inject 0.6 mg into the skin daily.  Dispense: 15 mL; Refill: 0 -     Pen Needles; Use once daily as directed  Dispense: 90 each; Refill: 0  Vitamin D deficiency Assessment & Plan: New.  Vitamin D level low at 27.  We discussed a target goal over 50.  Vitamin D deficiency can contribute to fatigue, bone loss and poor immune function.  She is currently not on a multivitamin or vitamin D supplement.  Begin vitamin D 50,000 IU once weekly.  Recheck level in 3 to 4 months  Orders: -     Vitamin D (Ergocalciferol); Take 1 capsule (50,000 Units total) by mouth every 7 (seven) days.  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 Reviewed ALT of 37.  Her liver enzymes have not previously been elevated.  She denies heavy intake of alcohol or Tylenol containing products.  She denies abdominal pain or change in urine color.  With her BMI of 41, this is likely fatty liver.  Reviewed check liver enzymes in the next 3 to 4 months.  If her ALT remains elevated, will obtain a liver ultrasound to look for hepatic steatosis.       She was informed of the importance of frequent follow up visits to maximize her success with intensive lifestyle modifications for her multiple health conditions.   ATTESTASTION STATEMENTS:  Reviewed by clinician on day of visit: allergies, medications, problem list, medical history, surgical history, family history, social history, and previous encounter notes pertinent to obesity diagnosis.   I have personally spent 30 minutes total time today in preparation, patient care, nutritional counseling and documentation for this visit, including the following: review of clinical lab tests; review of medical tests/procedures/services.      Glennis Brink,  DO DABFM, DABOM [facility] [address] [place], [place] [address] [phone]

## 2023-08-07 NOTE — Telephone Encounter (Signed)
Prior authorization denied for patients Saxenda. Patient needs to be in a weight loss program for 6 months. Patient notified and Dr. Cathey Endow notified.

## 2023-08-07 NOTE — Assessment & Plan Note (Signed)
New.  Vitamin D level low at 27.  We discussed a target goal over 50.  Vitamin D deficiency can contribute to fatigue, bone loss and poor immune function.  She is currently not on a multivitamin or vitamin D supplement.  Begin vitamin D 50,000 IU once weekly.  Recheck level in 3 to 4 months

## 2023-08-07 NOTE — Assessment & Plan Note (Signed)
Despite getting in all 3 meals on her meal plan, she does complain of hunger and cravings for starchy foods and increased food volumes at the end of her meals.  She has previously had adverse side effects from phentermine.  We discussed increasing intake of water with a goal of a least 100 ounces per day and increasing intake of fruit to 2 servings per day, allowing plenty of nonstarchy vegetables and one quarter plate of a high fiber starch with dinner.  Begin Saxenda 0.6 mg once weekly injection.  We discussed mechanism of action and potential adverse side effects.  She will use this along with a reduced calorie high-protein low carbohydrate diet, regular exercise and mindful eating.  She is currently not on birth control.  She agrees to using a period tracking app, using condoms as needed.  She denies a personal or family history for pancreatitis, multiple endocrine neoplasia type II or medullary thyroid carcinoma.  She will check out [email or web address] for pen training video.

## 2023-08-29 ENCOUNTER — Ambulatory Visit: Payer: 59 | Admitting: Family Medicine

## 2023-08-29 ENCOUNTER — Encounter: Payer: Self-pay | Admitting: Family Medicine

## 2023-08-29 DIAGNOSIS — E559 Vitamin D deficiency, unspecified: Secondary | ICD-10-CM | POA: Diagnosis not present

## 2023-08-29 DIAGNOSIS — Z6841 Body Mass Index (BMI) 40.0 and over, adult: Secondary | ICD-10-CM

## 2023-08-29 DIAGNOSIS — R7401 Elevation of levels of liver transaminase levels: Secondary | ICD-10-CM | POA: Diagnosis not present

## 2023-08-29 MED ORDER — VITAMIN D (ERGOCALCIFEROL) 1.25 MG (50000 UNIT) PO CAPS: 50000.0000 [IU] | ORAL_CAPSULE | ORAL | 0 refills | Status: DC

## 2023-08-29 MED ORDER — LOMAIRA 8 MG PO TABS: ORAL_TABLET | ORAL | 0 refills | Status: DC

## 2023-08-29 NOTE — Patient Instructions (Signed)
Begin Lomaira 8 mg tab 30 min before your first meal of the day and the 2nd pill 30-60 min after your 2nd meal of the day Call if any problems or questions Allow one caffeinated drink daily  Continue to work on eating on a schedule with a focus on lean protein, fruits and veggies Keep up the workouts! Set a goal of weight training 2 x a week [place] job reducing alcohol frequency

## 2023-08-29 NOTE — Assessment & Plan Note (Signed)
Struggling with food noise despite eating on a schedule with lean protein and fiber at meals. Bernie Covey will not be covered until late March  Had some success in past with phentermine with stimulant side effects  Begin Lomaira 8 mg bid  Reviewed potential SE, MOA Continue along with a reduced kcal diet and plan for regular exercise Informed consent signed PDMP reviewed

## 2023-08-29 NOTE — Assessment & Plan Note (Addendum)
Last vitamin D Lab Results  Component Value Date   VD25OH 27.0 (L) 07/24/2023   Doing well on RX vitamin D weekly  Repeat lab in Jan

## 2023-08-29 NOTE — Assessment & Plan Note (Signed)
Lab Results  Component Value Date   ALT 37 (H) 07/24/2023    No previous hx of fatty liver Denies abdominal pain or nausea Denies frequent tylenol use  Repeat hepatic function panel in Jan

## 2023-08-29 NOTE — Progress Notes (Signed)
Office: [phone]  /  Fax: [phone]  WEIGHT SUMMARY AND BIOMETRICS  Starting Date: 07/24/23  Starting Weight: 278lb   Weight Lost Since Last Visit: 4lb   Vitals Temp: 98.3 F (36.8 C) BP: 120/84 Pulse Rate: 82 SpO2: 97 %   Body Composition  Body Fat %: 45 % Fat Mass (lbs): 124 lbs Muscle Mass (lbs): 144 lbs Total Body Water (lbs): 97 lbs Visceral Fat Rating : 11     HPI  Chief Complaint: OBESITY  Amanda [name] is here to discuss her progress with her obesity treatment plan. She is on the the Category 3 Plan and states she is following her eating plan approximately 50 % of the time. She states she is exercising 30-60 minutes 2-3 times per week.   Interval History:  Since last office visit she is down 4 lb in the past 4 weeks This gives her a net weight loss of 3 lb in 1 month She is up 1.8 lb of muscle mass and down 5.6 lb of body fat since last visit She has previously taken Phentermine 37.5 mg 1/2 tab with one episode of face twitching outside of our practice She is working on cooking more at home and is practicing portion control She denies sugar cravings She is having a lot of food noise in the afternoon She is getting in a good amount of sleep She is drinking less ETOH She is walking for exercise 2 x a week and plans to add in weight training before work  Pharmacotherapy: none  PHYSICAL EXAM:  Blood pressure 120/84, pulse 82, temperature 98.3 F (36.8 C), height 5\' 9"  (1.753 m), weight 275 lb (124.7 kg), SpO2 97%. Body mass index is 40.61 kg/m.  General: She is overweight, cooperative, alert, well developed, and in no acute distress. PSYCH: Has normal mood, affect and thought process.   Lungs: Normal breathing effort, no conversational dyspnea.   ASSESSMENT AND PLAN  TREATMENT PLAN FOR OBESITY:  Recommended Dietary Goals  [name] is currently in the action stage of change. As such, her goal is to continue weight management plan. She has  agreed to the Category 3 Plan.  Behavioral Intervention  We discussed the following Behavioral Modification Strategies today: increasing lean protein intake to established goals, increasing vegetables, increasing lower glycemic fruits, increasing fiber rich foods, avoiding skipping meals, increasing water intake , work on meal planning and preparation, keeping healthy foods at home, work on managing stress, creating time for self-care and relaxation, avoiding temptations and identifying enticing environmental cues, planning for success, better snacking choices, and continue to work on maintaining a reduced calorie state, getting the recommended amount of protein, incorporating whole foods, making healthy choices, staying well hydrated and practicing mindfulness when eating..  Additional resources provided today: NA  Recommended Physical Activity Goals  Amanda [name] has been advised to work up to 150 minutes of moderate intensity aerobic activity a week and strengthening exercises 2-3 times per week for cardiovascular health, weight loss maintenance and preservation of muscle mass.   She has agreed to [employer] strengthening exercises with a goal of 2-3 sessions a week  and Increase the intensity, frequency or duration of aerobic exercises    Pharmacotherapy changes for the treatment of obesity: start Lomaira 8 mg bid; PDMP reviewed.  Informed consent signed.    ASSOCIATED CONDITIONS ADDRESSED TODAY  Morbid obesity (HCC) Assessment & Plan: Struggling with food noise despite eating on a schedule with lean protein and fiber at meals. Bernie Covey will not be covered  until late March  Had some success in past with phentermine with stimulant side effects  Begin Lomaira 8 mg bid  Reviewed potential SE, MOA Continue along with a reduced kcal diet and plan for regular exercise Informed consent signed PDMP reviewed   Orders: -     Lomaira; 1 tab po 30 min before breakfast and 1 tab po 1 hr after lunch   Dispense: 60 tablet; Refill: 0  Vitamin D deficiency Assessment & Plan: Last vitamin D Lab Results  Component Value Date   VD25OH 27.0 (L) 07/24/2023   Doing well on RX vitamin D weekly  Repeat lab in Jan  Orders: -     Vitamin D (Ergocalciferol); Take 1 capsule (50,000 Units total) by mouth every 7 (seven) days.  Dispense: 4 capsule; Refill: 0  BMI 40.0-44.9, adult (HCC)  Elevated ALT measurement Assessment & Plan: Lab Results  Component Value Date   ALT 37 (H) 07/24/2023    No previous hx of fatty liver Denies abdominal pain or nausea Denies frequent tylenol use  Repeat hepatic function panel in Jan       She was informed of the importance of frequent follow up visits to maximize her success with intensive lifestyle modifications for her multiple health conditions.   ATTESTASTION STATEMENTS:  Reviewed by clinician on day of visit: allergies, medications, problem list, medical history, surgical history, family history, social history, and previous encounter notes pertinent to obesity diagnosis.   I have personally spent 30 minutes total time today in preparation, patient care, nutritional counseling and documentation for this visit, including the following: review of clinical lab tests; review of medical tests/procedures/services.      Glennis Brink, DO DABFM, DABOM [facility] [address] [place], [place] [address] [phone]

## 2023-09-23 ENCOUNTER — Ambulatory Visit: Payer: 59 | Admitting: Family Medicine

## 2023-09-23 ENCOUNTER — Encounter: Payer: Self-pay | Admitting: Family Medicine

## 2023-09-23 ENCOUNTER — Telehealth: Payer: Self-pay | Admitting: *Deleted

## 2023-09-23 VITALS — BP 133/85 | HR 75 | Temp 98.3°F | Ht 69.0 in | Wt 273.0 lb

## 2023-09-23 DIAGNOSIS — R7401 Elevation of levels of liver transaminase levels: Secondary | ICD-10-CM

## 2023-09-23 DIAGNOSIS — Z6841 Body Mass Index (BMI) 40.0 and over, adult: Secondary | ICD-10-CM

## 2023-09-23 DIAGNOSIS — F1010 Alcohol abuse, uncomplicated: Secondary | ICD-10-CM

## 2023-09-23 DIAGNOSIS — R632 Polyphagia: Secondary | ICD-10-CM | POA: Diagnosis not present

## 2023-09-23 DIAGNOSIS — E559 Vitamin D deficiency, unspecified: Secondary | ICD-10-CM

## 2023-09-23 DIAGNOSIS — E66813 Obesity, class 3: Secondary | ICD-10-CM

## 2023-09-23 MED ORDER — VITAMIN D (ERGOCALCIFEROL) 1.25 MG (50000 UNIT) PO CAPS: 50000.0000 [IU] | ORAL_CAPSULE | ORAL | 0 refills | Status: DC

## 2023-09-23 MED ORDER — LOMAIRA 8 MG PO TABS: ORAL_TABLET | ORAL | 0 refills | Status: DC

## 2023-09-23 NOTE — Assessment & Plan Note (Signed)
Recommend reducing ETOH to no more than 2 drinks/ day on the weekends Repeat liver enzymes in Jan

## 2023-09-23 NOTE — Progress Notes (Signed)
Office: [phone]  /  Fax: [phone]  WEIGHT SUMMARY AND BIOMETRICS  Starting Date: 07/24/23  Starting Weight: 278lb   Weight Lost Since Last Visit: 2lb   Vitals Temp: 98.3 F (36.8 C) BP: 133/85 Pulse Rate: 75 SpO2: 97 %   Body Composition  Body Fat %: 44.9 % Fat Mass (lbs): 122.6 lbs Muscle Mass (lbs): 143 lbs Total Body Water (lbs): 98 lbs Visceral Fat Rating : 11   HPI  Chief Complaint: OBESITY  Amanda [name] is here to discuss her progress with her obesity treatment plan. She is on the the Category 2 Plan and states she is following her eating plan approximately 70 % of the time. She states she is exercising 30-60 minutes 2-3 times per week.  Interval History:  Since last office visit she is down 2 lb She has been struggling with eating past the I'm full feeling She did start on Lomaira 8 mg bid Satiety has improved without adverse SE She has cut back on ETOH to 8-10 drinks/ week (on the weekends only) She tends to drink at home She reports a good support system She has a net weight loss of 5 lb in the past 2 mos Denies sugar cravings She has been walking more (gym incline walking on treadmill and has a walking pad at home)  Pharmacotherapy: Lomaira 8 mg bid  PHYSICAL EXAM:  Blood pressure 133/85, pulse 75, temperature 98.3 F (36.8 C), height 5\' 9"  (1.753 m), weight 273 lb (123.8 kg), SpO2 97%. Body mass index is 40.32 kg/m.  General: She is overweight, cooperative, alert, well developed, and in no acute distress. PSYCH: Has normal mood, affect and thought process.   Lungs: Normal breathing effort, no conversational dyspnea.   ASSESSMENT AND PLAN  TREATMENT PLAN FOR OBESITY:  Recommended Dietary Goals  Amanda [name] is currently in the action stage of change. As such, her goal is to continue weight management plan. She has agreed to the Category 3 Plan.  Behavioral Intervention  We discussed the following Behavioral Modification Strategies today:  increasing lean protein intake to established goals, increasing vegetables, avoiding skipping meals, increasing water intake , work on meal planning and preparation, practice mindfulness eating and understand the difference between hunger signals and cravings, work on managing stress, creating time for self-care and relaxation, avoiding temptations and identifying enticing environmental cues, planning for success, staying on track while traveling and vacationing, celebration eating strategies, and continue to work on maintaining a reduced calorie state, getting the recommended amount of protein, incorporating whole foods, making healthy choices, staying well hydrated and practicing mindfulness when eating..  Additional resources provided today: NA  Recommended Physical Activity Goals  [name] has been advised to work up to 150 minutes of moderate intensity aerobic activity a week and strengthening exercises 2-3 times per week for cardiovascular health, weight loss maintenance and preservation of muscle mass.   She has agreed to [employer] strengthening exercises with a goal of 2-3 sessions a week  and Start aerobic activity with a goal of 150 minutes a week at moderate intensity.   Pharmacotherapy changes for the treatment of obesity: none  ASSOCIATED CONDITIONS ADDRESSED TODAY  Polyphagia Assessment & Plan: Improving some on Lomaira 8 mg bid, eating on a schedule and a focus on lean protein and fiber with meals Continues to struggle with overeating at times with behavioral changes Denies adverse SE from Lomaira and BP/ HR are WNL  Continue Lomaira 8 mg bid Increase intake of lean protein to ~130 g/  day Hydrate well with water and sugar free drinks   Vitamin D deficiency Assessment & Plan: Last vitamin D Lab Results  Component Value Date   VD25OH 27.0 (L) 07/24/2023   Doing well on RX vitamin D weekly Energy level unchanged  Repeat lab in Jan  Orders: -     Vitamin D (Ergocalciferol);  Take 1 capsule (50,000 Units total) by mouth every 7 (seven) days.  Dispense: 4 capsule; Refill: 0  Class 3 severe obesity due to excess calories with body mass index (BMI) of 40.0 to 44.9 in adult, unspecified whether serious comorbidity present (HCC) -     Lomaira; 1 tab po 30 min before breakfast and 1 tab po 1 hr after lunch  Dispense: 60 tablet; Refill: 0  Excessive drinking alcohol Assessment & Plan: She denies a hx of alcohol abuse but she has cut back to ~10 drinks on the weekends, usually at home Denies poor influence by others Denies changes in mood or stressors Reducing ETOH intake has improved her weight loss Denies over snacking with ETOH intake  Continue to reduce ETOH intake, practicing mindfulness, stress reduction and self care CBT will be set up for both polyphagia and ETOH use   Elevated ALT measurement Assessment & Plan: Recommend reducing ETOH to no more than 2 drinks/ day on the weekends Repeat liver enzymes in Jan       She was informed of the importance of frequent follow up visits to maximize her success with intensive lifestyle modifications for her multiple health conditions.   ATTESTASTION STATEMENTS:  Reviewed by clinician on day of visit: allergies, medications, problem list, medical history, surgical history, family history, social history, and previous encounter notes pertinent to obesity diagnosis.   I have personally spent 30 minutes total time today in preparation, patient care, nutritional counseling and documentation for this visit, including the following: review of clinical lab tests; review of medical tests/procedures/services.      Glennis Brink, DO DABFM, DABOM [facility] [address] [place], [place] [address] [phone]

## 2023-09-23 NOTE — Assessment & Plan Note (Signed)
Improving some on Lomaira 8 mg bid, eating on a schedule and a focus on lean protein and fiber with meals Continues to struggle with overeating at times with behavioral changes Denies adverse SE from [facility] and BP/ HR are WNL  Continue Lomaira 8 mg bid Increase intake of lean protein to ~130 g/ day Hydrate well with water and sugar free drinks

## 2023-09-23 NOTE — Telephone Encounter (Signed)
Phone call to patient to let her know that she is going to have to fill out the paper work before she can schedule with Dr. Dewaine Conger. I mailed paper work to patient per her request. Once we get that back patient can schedule her appointment.

## 2023-09-23 NOTE — Assessment & Plan Note (Signed)
She denies a hx of alcohol abuse but she has cut back to ~10 drinks on the weekends, usually at home Denies poor influence by others Denies changes in mood or stressors Reducing ETOH intake has improved her weight loss Denies over snacking with ETOH intake  Continue to reduce ETOH intake, practicing mindfulness, stress reduction and self care CBT will be set up for both polyphagia and ETOH use

## 2023-09-23 NOTE — Assessment & Plan Note (Signed)
Last vitamin D Lab Results  Component Value Date   VD25OH 27.0 (L) 07/24/2023   Doing well on RX vitamin D weekly Energy level unchanged  Repeat lab in Jan

## 2023-10-21 ENCOUNTER — Ambulatory Visit: Payer: 59 | Admitting: Family Medicine

## 2023-10-21 ENCOUNTER — Encounter: Payer: Self-pay | Admitting: Family Medicine

## 2023-10-21 DIAGNOSIS — E559 Vitamin D deficiency, unspecified: Secondary | ICD-10-CM | POA: Diagnosis not present

## 2023-10-21 DIAGNOSIS — E66813 Obesity, class 3: Secondary | ICD-10-CM

## 2023-10-21 DIAGNOSIS — Z6841 Body Mass Index (BMI) 40.0 and over, adult: Secondary | ICD-10-CM

## 2023-10-21 MED ORDER — LOMAIRA 8 MG PO TABS: ORAL_TABLET | ORAL | 0 refills | Status: DC

## 2023-10-21 MED ORDER — VITAMIN D (ERGOCALCIFEROL) 1.25 MG (50000 UNIT) PO CAPS: 50000.0000 [IU] | ORAL_CAPSULE | ORAL | 0 refills | Status: DC

## 2023-10-21 NOTE — Assessment & Plan Note (Signed)
Last vitamin D Lab Results  Component Value Date   VD25OH 27.0 (L) 07/24/2023   She is doing well on vitamin D 50,000 IU once weekly Energy level is unchanged  Repeat lab next visit

## 2023-10-21 NOTE — Progress Notes (Signed)
Office: [phone]  /  Fax: [phone]  WEIGHT SUMMARY AND BIOMETRICS  Starting Date: 07/24/23  Starting Weight: 278lb   Weight Lost Since Last Visit: 4lb   Vitals Temp: 98.4 F (36.9 C) BP: 132/88 Pulse Rate: 79 SpO2: 98 %   Body Composition  Body Fat %: 45 % Fat Mass (lbs): 121 lbs Muscle Mass (lbs): 140.6 lbs Total Body Water (lbs): 97.2 lbs Visceral Fat Rating : 10    HPI  Chief Complaint: OBESITY  Amanda [name] is here to discuss her progress with her obesity treatment plan. She is on the the Category 2 Plan and states she is following her eating plan approximately 50 % of the time. She states she is exercising 0 minutes 0 times per week.  Interval History:  Since last office visit she is down 4 lb This gives her a net weight loss of 9 lb in the past 3 mos She is working on meal planning and healthier food choices She has cut back on ETOH intake She has had celebration eating She is set up to see Dr Dewaine Conger for CBT tomorrow Her fiance is supportive She would like to increase workouts to 3 days/ wk in Jan at the gym  Pharmacotherapy: Lomaira 8 mg bid   PHYSICAL EXAM:  Blood pressure 132/88, pulse 79, temperature 98.4 F (36.9 C), height 5\' 9"  (1.753 m), weight 269 lb (122 kg), SpO2 98%. Body mass index is 39.72 kg/m.  General: She is overweight, cooperative, alert, well developed, and in no acute distress. PSYCH: Has normal mood, affect and thought process.   Lungs: Normal breathing effort, no conversational dyspnea.   ASSESSMENT AND PLAN  TREATMENT PLAN FOR OBESITY:  Recommended Dietary Goals  Amanda [name] is currently in the action stage of change. As such, her goal is to continue weight management plan. She has agreed to the Category 3 Plan.  Behavioral Intervention  We discussed the following Behavioral Modification Strategies today: increasing lean protein intake to established goals, increasing fiber rich foods, avoiding skipping meals,  increasing water intake , work on meal planning and preparation, keeping healthy foods at home, identifying sources and decreasing liquid calories, avoiding temptations and identifying enticing environmental cues, continue to practice mindfulness when eating, planning for success, and continue to work on maintaining a reduced calorie state, getting the recommended amount of protein, incorporating whole foods, making healthy choices, staying well hydrated and practicing mindfulness when eating..  Additional resources provided today: NA  Recommended Physical Activity Goals  Autumm has been advised to work up to 150 minutes of moderate intensity aerobic activity a week and strengthening exercises 2-3 times per week for cardiovascular health, weight loss maintenance and preservation of muscle mass.   She has agreed to Think about enjoyable ways to increase daily physical activity and overcoming barriers to exercise and Increase physical activity in their day and reduce sedentary time (increase NEAT).  Pharmacotherapy changes for the treatment of obesity: None  ASSOCIATED CONDITIONS ADDRESSED TODAY  Vitamin D deficiency Assessment & Plan: Last vitamin D Lab Results  Component Value Date   VD25OH 27.0 (L) 07/24/2023   She is doing well on vitamin D 50,000 IU once weekly Energy level is unchanged  Repeat lab next visit  Orders: -     Vitamin D (Ergocalciferol); Take 1 capsule (50,000 Units total) by mouth every 7 (seven) days.  Dispense: 4 capsule; Refill: 0  Class 3 severe obesity due to excess calories with body mass index (BMI) of 40.0 to 44.9  in adult, unspecified whether serious comorbidity present (HCC) -     Lomaira; 1 tab po 30 min before breakfast and 1 tab po 1 hr after lunch  Dispense: 60 tablet; Refill: 0      She was informed of the importance of frequent follow up visits to maximize her success with intensive lifestyle modifications for her multiple health  conditions.   ATTESTASTION STATEMENTS:  Reviewed by clinician on day of visit: allergies, medications, problem list, medical history, surgical history, family history, social history, and previous encounter notes pertinent to obesity diagnosis.   I have personally spent 30 minutes total time today in preparation, patient care, nutritional counseling and documentation for this visit, including the following: review of clinical lab tests; review of medical tests/procedures/services.      Glennis Brink, DO DABFM, DABOM [facility] [address] [place], [place] [address] [phone]

## 2023-10-22 ENCOUNTER — Telehealth (INDEPENDENT_AMBULATORY_CARE_PROVIDER_SITE_OTHER): Payer: Self-pay | Admitting: Psychology

## 2023-10-22 DIAGNOSIS — F5089 Other specified eating disorder: Secondary | ICD-10-CM

## 2023-10-22 NOTE — Progress Notes (Signed)
Office: [phone]  /  Fax: [phone]    Date: October 22, 2023    Appointment Start Time: 9:00am Duration: 39 minutes Provider: Lawerance Cruel, Psy.D. Type of Session: Intake for Individual Therapy  Location of Patient: Home (private location) Location of Provider: Provider's home (private office) Type of Contact: Telepsychological Visit via MyChart Video Visit  Informed Consent: Prior to proceeding with today's appointment, two pieces of identifying information were obtained. In addition, Shiori's physical location at the time of this appointment was obtained as well a phone number she could be reached at in the event of technical difficulties. Merla and this provider participated in today's telepsychological service.   The provider's role was explained to [employer]. The provider reviewed and discussed issues of confidentiality, privacy, and limits therein (e.g., reporting obligations). In addition to verbal informed consent, written informed consent for psychological services was obtained prior to the initial appointment. Since the clinic is not a 24/7 crisis center, mental health emergency resources were shared and this  provider explained MyChart, e-mail, voicemail, and/or other messaging systems should be utilized only for non-emergency reasons. This provider also explained that information obtained during appointments will be placed in Makendra's medical record and relevant information will be shared with other providers at [facility] at any locations for coordination of care. Brytnee agreed information may be shared with other [facility] providers as needed for coordination of care and by signing the service agreement document, she provided written consent for coordination of care. Prior to initiating telepsychological services, Paulene completed an informed consent document, which included the development of a safety plan (i.e., an emergency contact  and emergency resources) in the event of an emergency/crisis. Jenifer verbally acknowledged understanding she is ultimately responsible for understanding her insurance benefits for telepsychological and in-person services. This provider also reviewed confidentiality, as it relates to telepsychological services. Heather  acknowledged understanding that appointments cannot be recorded without both party consent and she is aware she is responsible for securing confidentiality on her end of the session. Jori verbally consented to proceed.  Chief Complaint/HPI: Parilee was referred by Dr. Seymour Bars on 09/23/2023.  During today's appointment, Leanne shared Dr. Cathey Endow recommended she meet with this provider as she reportedly discussed "thinking about food a lot." She was verbally administered a questionnaire assessing various behaviors related to emotional eating behaviors. Eulanda endorsed the following: overeat when you are celebrating, experience food cravings on a regular basis, eat certain foods when you are anxious, stressed, depressed, or your feelings are hurt, use food to help you cope with emotional situations, find food is comforting to you, overeat frequently when you are bored or lonely, not worry about what you eat when you are in a good mood, overeat when you are alone, but eat much less when you are with other people, and eat as a reward. She shared she craves chips and Donzetta Sprung. Rheannon believes the onset of emotional eating behaviors was likely in childhood. She explained a reduction in engagement in emotional eating behaviors since starting with the clinic and described the current frequency of emotional eating behaviors as "few times a week." In addition, Assata denied a history of binge eating behaviors; however, she discussed often feeling a need to eat everything on her plate.  Brenlyn denied a history of significantly restricting food intake, purging and engagement in other compensatory strategies for  weight loss, and has never been diagnosed with an eating disorder. She also denied a history of  treatment for emotional eating behaviors. Currently, Sevyn shared her journey with the clinic is "going good," but she acknowledged challenges with the holidays. Furthermore, Tiyah stated she was prescribed a low dose of phentermine and she plans to "incorporate exercise."  Mental Status Examination:  Appearance: neat Behavior: appropriate to circumstances Mood: neutral Affect: mood congruent Speech: WNL Eye Contact: appropriate Psychomotor Activity: WNL Gait: unable to assess  Thought Process: linear, logical, and goal directed and denies suicidal, homicidal, and self-harm ideation, plan and intent  Thought Content/Perception: no hallucinations, delusions, bizarre thinking or behavior endorsed or observed Orientation: AAOx4 Memory/Concentration: intact Insight/Judgment: fair  Family & Psychosocial History: Ludie reported she is engaged and she does not have any children. 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 Additionally, Oveda shared her highest level of education obtained is a high school diploma. Currently, Adilynn's social support system consists of her fiance, friends, mother, sister, mother in law, and grandmother. Moreover, Kamiryn stated she resides with her fiance and cat (Nola).   Medical History:  Past Medical History:  Diagnosis Date   Chest pain    Constipation    SOB (shortness of breath)    Past Surgical History:  Procedure Laterality Date   TONSILLECTOMY     Current Outpatient Medications on File Prior to Visit  Medication Sig Dispense Refill   linaclotide (LINZESS) 72 MCG capsule Take 1 capsule (72 mcg total) by mouth daily before breakfast. 30 capsule 2   MAGNESIUM PO Take by mouth.     Phentermine HCl (LOMAIRA) 8 MG TABS 1 tab po 30 min before breakfast and 1 tab po 1 hr after lunch 60 tablet 0   polyethylene  glycol (MIRALAX / GLYCOLAX) 17 g packet Take 17 g by mouth daily.     Vitamin D, Ergocalciferol, (DRISDOL) 1.25 MG (50000 UNIT) CAPS capsule Take 1 capsule (50,000 Units total) by mouth every 7 (seven) days. 4 capsule 0   No current facility-administered medications on file prior to visit.  Eppie stated she is medication compliant.   Mental Health History: Keiya reported she initiated therapeutic services via [facility] two months ago to address ongoing stressors, alcohol use, and impact of weight on self-esteem. Azaiah agreed to sign an authorization for coordination of care if deemed necessary. She denied a history of psychotropic medications. Nettye reported there is no history of hospitalizations for psychiatric concerns. Victoria endorsed a family history of alcohol abuse (father), bipolar (maternal grandmother), and unknown substance abuse (maternal grandfather). Furthermore, Tamisha reported there is no history of trauma including psychological, physical , and sexual abuse, as well as neglect.   Verlee described her typical mood lately as "okay." She discussed memory issues starting in adulthood, but described it as improving. Notably, she stated it worsens with alcohol use. Faigy disclosed she is trying "to cut back on alcohol because of the empty calories" and "hangovers lasting a long time." She expressed a belief she was "drinking too much" and it was "affecting [her] weight." Currently, she stated she consumes alcohol on the weekends [~ 6 standard drinks (wine, shot, mixed drink) over the span of approximately 2-5 hours on Saturdays and some Sundays]. Letty shared aside from the "occasional hangover and feeling sick," she has no experienced consequences secondary to her alcohol use. She denied driving under the influence of alcohol. Of note, she disclosed she does not eat when drinking alcohol as she feels full resulting in reduced protein intake for the day. She denied  tobacco use. She denied  illicit/recreational substance use. Furthermore, Miryah indicated she is not experiencing the following: hallucinations and delusions, paranoia, symptoms of mania , social withdrawal, crying spells, panic attacks, attention and concentration issues, and obsessions and compulsions. She also denied history of and current suicidal ideation, plan, and intent; history of and current homicidal ideation, plan, and intent; and history of and current engagement in self-harm.  Legal History: Elvin reported there is no history of legal involvement.   Structured Assessments Results: The Patient Health Questionnaire-9 (PHQ-9) is a self-report measure that assesses symptoms and severity of depression over the course of the last two weeks. Kare obtained a score of 3 suggesting minimal depression. Isela finds the endorsed symptoms to be somewhat difficult. [0= Not at all; 1= Several days; 2= More than half the days; 3= Nearly every day] Little interest or pleasure in doing things 0  Feeling down, depressed, or hopeless 0  Trouble falling or staying asleep, or sleeping too much- She stated she is pursuing a sleep study.  1  Feeling tired or having little energy 0  Poor appetite or overeating 1  Feeling bad about yourself --- or that you are a failure or have let yourself or your family down- Related to weight.  1  Trouble concentrating on things, such as reading the newspaper or watching television 0  Moving or speaking so slowly that other people could have noticed? Or the opposite --- being so fidgety or restless that you have been moving around a lot more than usual 0  Thoughts that you would be better off dead or hurting yourself in some way 0  PHQ-9 Score 3    The Generalized Anxiety Disorder-7 (GAD-7) is a brief self-report measure that assesses symptoms of anxiety over the course of the last two weeks. Koren obtained a score of 2 suggesting minimal anxiety. Bonni finds the endorsed symptoms to be  somewhat difficult. [0= Not at all; 1= Several days; 2= Over half the days; 3= Nearly every day] Feeling nervous, anxious, on edge 1  Not being able to stop or control worrying 0  Worrying too much about different things 0  Trouble relaxing 0  Being so restless that it's hard to sit still 0  Becoming easily annoyed or irritable 1  Feeling afraid as if something awful might happen 0  GAD-7 Score 2   Interventions:  Conducted a chart review Focused on rapport building Verbally administered PHQ-9 and GAD-7 for symptom monitoring Verbally administered Food & Mood questionnaire to assess various behaviors related to emotional eating Provided emphatic reflections and validation Psychoeducation provided regarding physical versus emotional hunger  Diagnostic Impressions & Provisional DSM-5 Diagnosis(es): Jaycie discussed a history of engagement in emotional eating behaviors likely starting in childhood. She described the current frequency of emotional eating behaviors as "few times a week." She denied engagement in any other disordered eating behaviors. Based on the aforementioned, the following diagnosis was assigned: F50.89 Other Specified Feeding or Eating Disorder, Emotional Eating Behaviors. Given the limited scope of this appointment and this provider's role with the clinic, it was unable to be determined if she meets criteria for a substance-related and addictive disorder at this time.    Plan: Andreah appears able and willing to participate as evidenced by engagement in reciprocal conversation and asking questions as needed for clarification. The next appointment is scheduled for 11/19/2023 at 12:30pm, which will be via MyChart Video Visit. The following treatment goal was established: increase coping skills. This provider  will regularly review the treatment plan and medical chart to keep informed of status changes. Shanita expressed understanding and agreement with the initial treatment plan of  care. Morrigan will be sent a handout via e-mail to utilize between now and the next appointment to increase awareness of hunger patterns and subsequent eating. Raissa provided verbal consent during today's appointment for this provider to send the handout via e-mail. Notably, Ashtin noted a plan to continue with EAP services at this time, but provided verbal consent for this provider to e-mail additional referral options for therapeutic services.     Lawerance Cruel, PsyD

## 2023-11-18 ENCOUNTER — Ambulatory Visit: Payer: 59 | Admitting: Family Medicine

## 2023-11-19 ENCOUNTER — Telehealth (INDEPENDENT_AMBULATORY_CARE_PROVIDER_SITE_OTHER): Payer: 59 | Admitting: Psychology

## 2023-11-19 DIAGNOSIS — F5089 Other specified eating disorder: Secondary | ICD-10-CM

## 2023-11-19 NOTE — Progress Notes (Signed)
  Office: [phone]  /  Fax: [phone]    Date: November 19, 2023  Appointment Start Time: 12:32pm Duration: 20 minutes Provider: Lawerance Cruel, Psy.D. Type of Session: Individual Therapy  Location of Patient: Parked in car at work (address obtained; private location) Location of Provider: Provider's Home (private office) Type of Contact: Telepsychological Visit via MyChart Video Visit  Session Content: Amanda [name] is a 28 y.o. female presenting for a follow-up appointment to address the previously established treatment goal of increasing coping skills.Today's appointment was a telepsychological visit. Amanda [name] provided verbal consent for today's telepsychological appointment and she is aware she is responsible for securing confidentiality on her end of the session. Prior to proceeding with today's appointment, Amanda [name]'s physical location at the time of this appointment was obtained as well a phone number she could be reached at in the event of technical difficulties. Amanda [name] and this provider participated in today's telepsychological service.   This provider conducted a brief check-in. Amanda [name] shared she is staying busy, noting she is "managing eating better." She feels the "food noise has lessened" since the last appointment with this provider. Amanda [name] also reported she continues to consume alcohol, but denied any issues. Psychoeducation regarding triggers for emotional eating was provided. Amanda [name] was provided a handout, and encouraged to utilize the handout between now and the next appointment to increase awareness of triggers and frequency. Amanda [name] agreed. This provider also discussed behavioral strategies for specific triggers, such as placing the utensil down when conversing to avoid mindless eating. Amanda [name] provided verbal consent during today's appointment for this provider to send a handout about triggers via e-mail. Overall, Amanda [name] was receptive to today's appointment as evidenced by openness to  sharing, responsiveness to feedback, and willingness to explore triggers for emotional eating.  Mental Status Examination:  Appearance: neat Behavior: appropriate to circumstances Mood: neutral Affect: mood congruent Speech: WNL Eye Contact: intermittent Psychomotor Activity: WNL Gait: unable to assess Thought Process: linear, logical, and goal directed and no evidence or endorsement of suicidal, homicidal, and self-harm ideation, plan and intent  Thought Content/Perception: no hallucinations, delusions, bizarre thinking or behavior endorsed or observed Orientation: AAOx4 Memory/Concentration: intact Insight: fair Judgment: fair  Interventions:  Conducted a brief chart review Provided empathic reflections and validation Provided positive reinforcement Employed supportive psychotherapy interventions to facilitate reduced distress and to improve coping skills with identified stressors Psychoeducation provided regarding triggers for emotional eating behaviors  DSM-5 Diagnosis(es): F50.89 Other Specified Feeding or Eating Disorder, Emotional Eating Behaviors  Treatment Goal & Progress: During the initial appointment with this provider, the following treatment goal was established: increase coping skills. Progress is limited, as Amanda [name] has just begun treatment with this provider; however, she is receptive to the interaction and interventions and rapport is being established.   Plan: The next appointment is scheduled for 12/10/2023 at 8:30am, which will be via MyChart Video Visit. The next session will focus on working towards the established treatment goal. Amanda [name] will continue with her primary therapist.    Lawerance Cruel, PsyD

## 2023-11-20 ENCOUNTER — Ambulatory Visit: Payer: 59 | Admitting: Family Medicine

## 2023-11-20 ENCOUNTER — Encounter: Payer: Self-pay | Admitting: Family Medicine

## 2023-11-20 VITALS — BP 129/82 | HR 72 | Temp 98.3°F | Ht 69.0 in | Wt 267.0 lb

## 2023-11-20 DIAGNOSIS — E66813 Obesity, class 3: Secondary | ICD-10-CM | POA: Diagnosis not present

## 2023-11-20 DIAGNOSIS — Z6841 Body Mass Index (BMI) 40.0 and over, adult: Secondary | ICD-10-CM | POA: Diagnosis not present

## 2023-11-20 DIAGNOSIS — R7401 Elevation of levels of liver transaminase levels: Secondary | ICD-10-CM | POA: Diagnosis not present

## 2023-11-20 DIAGNOSIS — E559 Vitamin D deficiency, unspecified: Secondary | ICD-10-CM | POA: Diagnosis not present

## 2023-11-20 MED ORDER — VITAMIN D (ERGOCALCIFEROL) 1.25 MG (50000 UNIT) PO CAPS: 50000.0000 [IU] | ORAL_CAPSULE | ORAL | 0 refills | Status: DC

## 2023-11-20 MED ORDER — LOMAIRA 8 MG PO TABS: ORAL_TABLET | ORAL | 0 refills | Status: DC

## 2023-11-20 MED ORDER — METFORMIN HCL ER 500 MG PO TB24: 500.0000 mg | ORAL_TABLET | Freq: Every day | ORAL | 0 refills | Status: DC

## 2023-11-20 NOTE — Progress Notes (Signed)
Office: [phone]  /  Fax: [phone]  WEIGHT SUMMARY AND BIOMETRICS  No data recorded  No data recorded   No data recorded   No data recorded  No data recorded   HPI  Chief Complaint: OBESITY  Amanda [name] is here to discuss her progress with her obesity treatment plan. She is on the the Category 2 Plan and states she is following her eating plan approximately 60 % of the time. She states she is exercising 30-60 minutes 2-3 times per week.  Interval History:  Since last office visit she is down 2 lb She is planning to move and has been stressed She has been drinking more ETOH and eating out a bit more She has been doing to the gym 2-3 x a week- doing cardio and weight training Taking Lomaira 8 mg bid and has seen improved appetite with it She had had some constipation - use Mag Oxide at night which helps Struggles to get in adequate fiber in diet She has been working OT at work to save money  Pharmacotherapy: Lomaira 8 mg bid  PHYSICAL EXAM:  Blood pressure 129/82, pulse 72, temperature 98.3 F (36.8 C), height 5\' 9"  (1.753 m), weight 267 lb (121.1 kg), SpO2 99%. Body mass index is 39.43 kg/m.  General: She is overweight, cooperative, alert, well developed, and in no acute distress. PSYCH: Has normal mood, affect and thought process.   Lungs: Normal breathing effort, no conversational dyspnea.   ASSESSMENT AND PLAN  TREATMENT PLAN FOR OBESITY:  Recommended Dietary Goals  Amanda [name] is currently in the action stage of change. As such, her goal is to continue weight management plan. She has agreed to the Category 3 Plan.  Behavioral Intervention  We discussed the following Behavioral Modification Strategies today: increasing lean protein intake to established goals, increasing fiber rich foods, increasing water intake , work on meal planning and preparation, keeping healthy foods at home, identifying sources and decreasing liquid calories, work on managing  stress, creating time for self-care and relaxation, avoiding temptations and identifying enticing environmental cues, planning for success, and continue to work on maintaining a reduced calorie state, getting the recommended amount of protein, incorporating whole foods, making healthy choices, staying well hydrated and practicing mindfulness when eating..  Additional resources provided today: NA  Recommended Physical Activity Goals  Makyna has been advised to work up to 150 minutes of moderate intensity aerobic activity a week and strengthening exercises 2-3 times per week for cardiovascular health, weight loss maintenance and preservation of muscle mass.   She has agreed to Start aerobic activity with a goal of 150 minutes a week at moderate intensity.   Pharmacotherapy changes for the treatment of obesity: add metformin XR 500 mg once daily with dinner  ASSOCIATED CONDITIONS ADDRESSED TODAY  Elevated ALT measurement Assessment & Plan: Lab Results  Component Value Date   ALT 20 11/20/2023    Repeat ALT level today, improved She did reduce her intake of ETOH and is working on weight reduction  Continue to limit ETOH to 2 drinks per week Continue active plan for weight reduction  Orders: -     Hepatic function panel  Class 3 severe obesity due to excess calories with body mass index (BMI) of 40.0 to 44.9 in adult, unspecified whether serious comorbidity present (HCC) -     Lomaira; 1 tab po 30 min before breakfast and 1 tab po 1 hr after lunch  Dispense: 60 tablet; Refill: 0 -  metFORMIN HCl ER; Take 1 tablet (500 mg total) by mouth daily with breakfast.  Dispense: 30 tablet; Refill: 0  Vitamin D deficiency Assessment & Plan: Last vitamin D Lab Results  Component Value Date   VD25OH 40.5 11/20/2023   She has been taking RX vitamin D weekly Level improved today.  Increased from 27 to 40.5 Energy level is improving Target goal 50-70  Continue RX vitamin D  weekly  Orders: -     Vitamin D (Ergocalciferol); Take 1 capsule (50,000 Units total) by mouth every 7 (seven) days.  Dispense: 4 capsule; Refill: 0 -     VITAMIN D 25 Hydroxy (Vit-D Deficiency, Fractures)      She was informed of the importance of frequent follow up visits to maximize her success with intensive lifestyle modifications for her multiple health conditions.   ATTESTASTION STATEMENTS:  Reviewed by clinician on day of visit: allergies, medications, problem list, medical history, surgical history, family history, social history, and previous encounter notes pertinent to obesity diagnosis.   I have personally spent 30 minutes total time today in preparation, patient care, nutritional counseling and documentation for this visit, including the following: review of clinical lab tests; review of medical tests/procedures/services.      Glennis Brink, DO DABFM, DABOM [facility] [address] [place], [place] [address] [phone]

## 2023-11-21 LAB — HEPATIC FUNCTION PANEL
ALT: 20 [IU]/L (ref 0–32)
AST: 17 [IU]/L (ref 0–40)
Albumin: 4.5 g/dL (ref 4.0–5.0)
Alkaline Phosphatase: 56 [IU]/L (ref 44–121)
Bilirubin Total: 0.5 mg/dL (ref 0.0–1.2)
Bilirubin, Direct: 0.17 mg/dL (ref 0.00–0.40)
Total Protein: 7.5 g/dL (ref 6.0–8.5)

## 2023-11-21 LAB — VITAMIN D 25 HYDROXY (VIT D DEFICIENCY, FRACTURES): Vit D, 25-Hydroxy: 40.5 ng/mL (ref 30.0–100.0)

## 2023-11-23 NOTE — Assessment & Plan Note (Signed)
Lab Results  Component Value Date   ALT 20 11/20/2023    Repeat ALT level today, improved She did reduce her intake of ETOH and is working on weight reduction  Continue to limit ETOH to 2 drinks per week Continue active plan for weight reduction

## 2023-11-23 NOTE — Assessment & Plan Note (Signed)
Last vitamin D Lab Results  Component Value Date   VD25OH 40.5 11/20/2023   She has been taking RX vitamin D weekly Level improved today.  Increased from 27 to 40.5 Energy level is improving Target goal 50-70  Continue RX vitamin D weekly

## 2023-12-10 ENCOUNTER — Telehealth (INDEPENDENT_AMBULATORY_CARE_PROVIDER_SITE_OTHER): Payer: 59 | Admitting: Psychology

## 2023-12-10 DIAGNOSIS — F5089 Other specified eating disorder: Secondary | ICD-10-CM

## 2023-12-10 NOTE — Progress Notes (Signed)
  Office: [phone]  /  Fax: [phone]    Date: December 10, 2023  Appointment Start Time: 8:33am Duration: 20 minutes Provider: Lawerance Cruel, Psy.D. Type of Session: Individual Therapy  Location of Patient: Home (private location) Location of Provider: Provider's Home (private office) Type of Contact: Telepsychological Visit via MyChart Video Visit  Session Content: Amanda [name] is a 28 y.o. female presenting for a follow-up appointment to address the previously established treatment goal of increasing coping skills.Today's appointment was a telepsychological visit. Amanda [name] provided verbal consent for today's telepsychological appointment and she is aware she is responsible for securing confidentiality on her end of the session. Prior to proceeding with today's appointment, Amanda [name]'s physical location at the time of this appointment was obtained as well a phone number she could be reached at in the event of technical difficulties. Amanda [name] and this provider participated in today's telepsychological service.   This provider conducted a brief check-in. Amanda [name] shared about recent events, including living with her future mother in law right now. She discussed "fasting," noting "It can help [her] not be so dependent on things." Further explored and processed. Reviewed triggers for emotional eating behaviors. Amanda [name] was engaged in problem solving to develop a plan to help cope with urges/cravings involving activities to relax, activities to distract, comforting places, people to call and connect with, and activities that help soothe senses. She was observed writing the plan. Overall, Amanda [name] was receptive to today's appointment as evidenced by openness to sharing, responsiveness to feedback, and willingness to implement discussed strategies .  Mental Status Examination:  Appearance: neat Behavior: appropriate to circumstances Mood: neutral Affect: mood congruent Speech: WNL Eye Contact:  intermittent Psychomotor Activity: WNL Gait: unable to assess Thought Process: linear, logical, and goal directed and no evidence or endorsement of suicidal, homicidal, and self-harm ideation, plan and intent  Thought Content/Perception: no hallucinations, delusions, bizarre thinking or behavior endorsed or observed Orientation: AAOx4 Memory/Concentration: intact Insight: fair Judgment: fair  Interventions:  Conducted a brief chart review Provided empathic reflections and validation Reviewed content from the previous session Provided positive reinforcement Employed supportive psychotherapy interventions to facilitate reduced distress and to improve coping skills with identified stressors Engaged patient in problem solving  DSM-5 Diagnosis(es): F50.89 Other Specified Feeding or Eating Disorder, Emotional Eating Behaviors  Treatment Goal & Progress: During the initial appointment with this provider, the following treatment goal was established: increase coping skills. Amanda [name] has demonstrated progress in her goal as evidenced by increased awareness of hunger patterns and increased awareness of triggers for emotional eating behaviors. Amanda [name] also continues to demonstrate willingness to engage in learned skill(s).  Plan: The next appointment is scheduled for 12/31/2023 at 11:30am, which will be via MyChart Video Visit. The next session will focus on reviewing learned skills and working towards the established treatment goal. Amanda [name] will continue with her primary therapist.    Lawerance Cruel, PsyD

## 2023-12-19 ENCOUNTER — Ambulatory Visit: Payer: 59 | Admitting: Family Medicine

## 2023-12-25 ENCOUNTER — Encounter: Payer: Self-pay | Admitting: Family Medicine

## 2023-12-25 ENCOUNTER — Ambulatory Visit (INDEPENDENT_AMBULATORY_CARE_PROVIDER_SITE_OTHER): Payer: 59 | Admitting: Family Medicine

## 2023-12-25 VITALS — BP 129/87 | HR 82 | Temp 98.2°F | Ht 69.0 in | Wt 271.0 lb

## 2023-12-25 DIAGNOSIS — R7401 Elevation of levels of liver transaminase levels: Secondary | ICD-10-CM

## 2023-12-25 DIAGNOSIS — R632 Polyphagia: Secondary | ICD-10-CM

## 2023-12-25 DIAGNOSIS — E66813 Obesity, class 3: Secondary | ICD-10-CM | POA: Diagnosis not present

## 2023-12-25 DIAGNOSIS — Z6841 Body Mass Index (BMI) 40.0 and over, adult: Secondary | ICD-10-CM

## 2023-12-25 DIAGNOSIS — E559 Vitamin D deficiency, unspecified: Secondary | ICD-10-CM | POA: Diagnosis not present

## 2023-12-25 MED ORDER — METFORMIN HCL ER 500 MG PO TB24: 500.0000 mg | ORAL_TABLET | Freq: Every day | ORAL | 0 refills | Status: DC

## 2023-12-25 NOTE — Progress Notes (Signed)
 Office: [phone]  /  Fax: [phone]  WEIGHT SUMMARY AND BIOMETRICS  Starting Date: 07/24/23  Starting Weight: 278lb   Weight Lost Since Last Visit: 0lb   Vitals Temp: 98.2 F (36.8 C) BP: 129/87 Pulse Rate: 82 SpO2: 98 %   Body Composition  Body Fat %: 46.4 % Fat Mass (lbs): 125.8 lbs Muscle Mass (lbs): 138 lbs Total Body Water (lbs): 98.8 lbs Visceral Fat Rating : 11    HPI  Chief Complaint: OBESITY  Amanda [name] is here to discuss her progress with her obesity treatment plan. She is on the the Category 2 Plan and states she is following her eating plan approximately 20 % of the time. She states she is exercising 30-45 minutes 1-2 times per week.  Interval History:  Since last office visit she is up 4 lb She is up 6.8 lb of body fat since last visit She has been taking Lomaira 8 mg bid Metformin XR 500 mg was added last visit She is not eating on plabn Her net weight loss is 7 lb in 5 mos of medically supervised weight management She plans to move with her fiance- currently living with in- laws Meals tend to be heavy and she has been drinking more wine at home  Pharmacotherapy: Lomaira 8 mg tab twice daily and metformin XR 500 mg once daily  PHYSICAL EXAM:  Blood pressure 129/87, pulse 82, temperature 98.2 F (36.8 C), height 5\' 9"  (1.753 m), weight 271 lb (122.9 kg), SpO2 98%. Body mass index is 40.02 kg/m.  General: She is overweight, cooperative, alert, well developed, and in no acute distress. PSYCH: Has normal mood, affect and thought process.   Lungs: Normal breathing effort, no conversational dyspnea.   ASSESSMENT AND PLAN  TREATMENT PLAN FOR OBESITY:  Recommended Dietary Goals  Amanda [name] is currently in the action stage of change. As such, her goal is to continue weight management plan. She has agreed to the Category 3 Plan.  Behavioral Intervention  We discussed the following Behavioral Modification Strategies today: increasing lean  protein intake to established goals, increasing fiber rich foods, increasing water intake , keeping healthy foods at home, practice mindfulness eating and understand the difference between hunger signals and cravings, work on managing stress, creating time for self-care and relaxation, avoiding temptations and identifying enticing environmental cues, continue to work on implementation of reduced calorie nutritional plan, and continue to work on maintaining a reduced calorie state, getting the recommended amount of protein, incorporating whole foods, making healthy choices, staying well hydrated and practicing mindfulness when eating..  Additional resources provided today: NA  Recommended Physical Activity Goals  Amanda [name] has been advised to work up to 150 minutes of moderate intensity aerobic activity a week and strengthening exercises 2-3 times per week for cardiovascular health, weight loss maintenance and preservation of muscle mass.   She has agreed to [employer] strengthening exercises with a goal of 2-3 sessions a week  and Start aerobic activity with a goal of 150 minutes a week at moderate intensity.   Pharmacotherapy changes for the treatment of obesity: Discontinue Lomaira due to lack of weight loss  ASSOCIATED CONDITIONS ADDRESSED TODAY  Polyphagia Patient continues to struggle with poor food choices, overeating and a lack of support.  Life stressors have been a contributing factor.  Her motivation to make change has been lacking.    She has felt some improvement in satiety on Lomaira 8 mg tab twice daily but has been gaining weight in the past month.  Discontinue Lomaira.  Recommend tracking daily intake making sure she is hitting her calorie target with a goal of 1600 to 1700 cal/day which should include 100 or more grams of protein intake daily  Class 3 severe obesity due to excess calories with body mass index (BMI) of 40.0 to 44.9 in adult, unspecified whether serious comorbidity  present (HCC) -     metFORMIN HCl ER; Take 1 tablet (500 mg total) by mouth daily with breakfast.  Dispense: 30 tablet; Refill: 0 She is doing well on metformin 500 mg XR once daily, used off label without type 2 diabetes.  This has helped some with carb and sugar cravings.  She is tolerating this well without adverse side effects.  Consider use of Wegovy or Zepbound next visit  Update labs in 1 to 2 months  Vitamin D deficiency Reviewed labs from last visit with patient.  Her vitamin D level has improved on vitamin D 50,000 IU once weekly.  She may discontinue vitamin D 50,000 IU once weekly and switch over to over-the-counter vitamin D3 2000 IU once daily   Elevated ALT measurement Improved Reviewed labs from last visit with patient.  She has made a conscious effort to reduce her heavy intake of alcohol.  Her liver enzymes have normalized.     She was informed of the importance of frequent follow up visits to maximize her success with intensive lifestyle modifications for her multiple health conditions.   ATTESTASTION STATEMENTS:  Reviewed by clinician on day of visit: allergies, medications, problem list, medical history, surgical history, family history, social history, and previous encounter notes pertinent to obesity diagnosis.   I have personally spent 30 minutes total time today in preparation, patient care, nutritional counseling and documentation for this visit, including the following: review of clinical lab tests; review of medical tests/procedures/services.      Glennis Brink, DO DABFM, DABOM [facility] [address] [place], [place] [address] [phone]

## 2023-12-31 ENCOUNTER — Telehealth (INDEPENDENT_AMBULATORY_CARE_PROVIDER_SITE_OTHER): Payer: 59 | Admitting: Psychology

## 2023-12-31 DIAGNOSIS — F5089 Other specified eating disorder: Secondary | ICD-10-CM

## 2023-12-31 NOTE — Progress Notes (Signed)
  Office: [phone]  /  Fax: [phone]    Date: December 31, 2023  Appointment Start Time: 11:30am Duration: 20 minutes Provider: Lawerance Cruel, Psy.D. Type of Session: Individual Therapy  Location of Patient: Home (private location) Location of Provider: Provider's Home (private office) Type of Contact: Telepsychological Visit via MyChart Video Visit  Session Content: Amanda [name] is a 28 y.o. female presenting for a follow-up appointment to address the previously established treatment goal of increasing coping skills.Today's appointment was a telepsychological visit. Amanda [name] provided verbal consent for today's telepsychological appointment and she is aware she is responsible for securing confidentiality on her end of the session. Prior to proceeding with today's appointment, Amanda [name]'s physical location at the time of this appointment was obtained as well a phone number she could be reached at in the event of technical difficulties. Amanda [name] and this provider participated in today's telepsychological service.   Of note, today's appointment was switched to a regular telephone call at 11:45am with Amanda [name]'s verbal consent due to technical issues.   This provider conducted a brief check-in. Amanda [name] reported she is getting into the "groove of things." She acknowledged challenges with saying "no" to certain foods. She identified accessibility has made it challenging, but discussed implementation of the previously developed plan on a "few" occasions has helped. Psychoeducation provided regarding mindful eating strategies (sit down, slowly chew, savor, simplify, and smile). Overall, Amanda [name] was receptive to today's appointment as evidenced by openness to sharing, responsiveness to feedback, and willingness to implement discussed strategies .  Mental Status Examination:  Appearance: neat Behavior: appropriate to circumstances Mood: neutral Affect: mood congruent Speech: WNL Eye Contact:  appropriate Psychomotor Activity: WNL Gait: unable to assess Thought Process: linear, logical, and goal directed and no evidence or endorsement of suicidal, homicidal, and self-harm ideation, plan and intent  Thought Content/Perception: no hallucinations, delusions, bizarre thinking or behavior endorsed or observed Orientation: AAOx4 Memory/Concentration: intact Insight: fair Judgment: fair  Interventions:  Conducted a brief chart review Provided empathic reflections and validation Reviewed content from the previous session Provided positive reinforcement Employed supportive psychotherapy interventions to facilitate reduced distress and to improve coping skills with identified stressors Psychoeducation provided regarding mindfulness  DSM-5 Diagnosis(es): F50.89 Other Specified Feeding or Eating Disorder, Emotional Eating Behaviors  Treatment Goal & Progress: During the initial appointment with this provider, the following treatment goal was established: increase coping skills. Amanda [name] has demonstrated progress in her goal as evidenced by increased awareness of hunger patterns and increased awareness of triggers for emotional eating behaviors. Amanda [name] also continues to demonstrate willingness to engage in learned skill(s).  Plan: The next appointment is scheduled for 01/21/2024 at 10am, which will be via MyChart Video Visit. The next session will focus on working towards the established treatment goal. Amanda [name] will continue with her primary therapist.    Lawerance Cruel, PsyD

## 2024-01-21 ENCOUNTER — Telehealth (INDEPENDENT_AMBULATORY_CARE_PROVIDER_SITE_OTHER): Admitting: Psychology

## 2024-01-21 DIAGNOSIS — F5089 Other specified eating disorder: Secondary | ICD-10-CM

## 2024-01-21 NOTE — Progress Notes (Signed)
  Office: [phone]  /  Fax: [phone]    Date: January 21, 2024  Appointment Start Time: 10:01am Duration: 17 minutes Provider: Lawerance Cruel, Psy.D. Type of Session: Individual Therapy  Location of Patient: Home (private location) Location of Provider: Provider's Home (private office) Type of Contact: Telepsychological Visit via MyChart Video Visit  Session Content: Amanda [name] is a 28 y.o. female presenting for a follow-up appointment to address the previously established treatment goal of increasing coping skills.Today's appointment was a telepsychological visit. Amanda [name] provided verbal consent for today's telepsychological appointment and she is aware she is responsible for securing confidentiality on her end of the session. Prior to proceeding with today's appointment, Amanda [name]'s physical location at the time of this appointment was obtained as well a phone number she could be reached at in the event of technical difficulties. Amanda [name] and this provider participated in today's telepsychological service.   This provider conducted a brief check-in. Amanda [name] shared about recent events. Regarding eating habits, she noted, "It's been going good." She also discussed implementing mindful eating strategies. Her experiences were processed. Regarding emotional eating behaviors, she noted a reduction and provided examples. Explored and identified non-scale victories. She was encouraged to write down the aforementioned for future reference, which she did. Furthermore, termination planning was discussed. Amanda [name] was receptive to a follow-up appointment in 3-4 weeks and an additional follow-up/termination appointment in 4-5 weeks after that. Overall, Amanda [name] was receptive to today's appointment as evidenced by openness to sharing, responsiveness to feedback, and willingness to continue engaging in learned skills.  Mental Status Examination:  Appearance: neat Behavior: appropriate to circumstances Mood:  neutral Affect: mood congruent Speech: WNL Eye Contact: appropriate Psychomotor Activity: WNL Gait: unable to assess Thought Process: linear, logical, and goal directed and no evidence or endorsement of suicidal, homicidal, and self-harm ideation, plan and intent  Thought Content/Perception: no hallucinations, delusions, bizarre thinking or behavior endorsed or observed Orientation: AAOx4 Memory/Concentration: intact Insight: good Judgment: good  Interventions:  Conducted a brief chart review Provided empathic reflections and validation Reviewed content from the previous session Provided positive reinforcement Employed supportive psychotherapy interventions to facilitate reduced distress and to improve coping skills with identified stressors Reviewed learned skills Discussed termination planning  DSM-5 Diagnosis(es): F50.89 Other Specified Feeding or Eating Disorder, Emotional Eating Behaviors  Treatment Goal & Progress: During the initial appointment with this provider, the following treatment goal was established: increase coping skills. Amanda [name] has demonstrated progress in her goal as evidenced by increased awareness of hunger patterns, increased awareness of triggers for emotional eating behaviors, and reduction in emotional eating behaviors. Amanda [name] also continues to demonstrate willingness to engage in learned skill(s).   Plan: The next appointment is scheduled for 02/18/2024 at 11am, which will be via MyChart Video Visit. The next session will focus on working towards the established treatment goal. Amanda [name] will continue with her primary therapist.    Lawerance Cruel, PsyD

## 2024-01-23 ENCOUNTER — Telehealth: Payer: Self-pay | Admitting: *Deleted

## 2024-01-23 ENCOUNTER — Encounter: Payer: Self-pay | Admitting: Family Medicine

## 2024-01-23 ENCOUNTER — Ambulatory Visit: Payer: 59 | Admitting: Family Medicine

## 2024-01-23 VITALS — BP 120/85 | HR 78 | Temp 98.0°F | Ht 69.0 in | Wt 273.0 lb

## 2024-01-23 DIAGNOSIS — F1010 Alcohol abuse, uncomplicated: Secondary | ICD-10-CM

## 2024-01-23 DIAGNOSIS — E559 Vitamin D deficiency, unspecified: Secondary | ICD-10-CM

## 2024-01-23 DIAGNOSIS — K5909 Other constipation: Secondary | ICD-10-CM

## 2024-01-23 DIAGNOSIS — F5089 Other specified eating disorder: Secondary | ICD-10-CM

## 2024-01-23 DIAGNOSIS — E66813 Obesity, class 3: Secondary | ICD-10-CM

## 2024-01-23 DIAGNOSIS — Z6841 Body Mass Index (BMI) 40.0 and over, adult: Secondary | ICD-10-CM

## 2024-01-23 MED ORDER — PEN NEEDLES 32G X 4 MM MISC: 1 refills | Status: DC

## 2024-01-23 MED ORDER — SAXENDA 18 MG/3ML ~~LOC~~ SOPN: 0.6000 mg | PEN_INJECTOR | Freq: Every day | SUBCUTANEOUS | 0 refills | Status: DC

## 2024-01-23 NOTE — Addendum Note (Signed)
 Addended by: Glennis Brink on: 01/23/2024 10:03 AM   Modules accepted: Orders

## 2024-01-23 NOTE — Telephone Encounter (Signed)
 Prior authorization done via cover my meds for her Saxenda. It got an approval.  Message from plan: Your PA request has been approved. Additional information will be provided in the approval communication. (Message [id number]). Authorization Expiration Date: May 22, 2024.

## 2024-01-23 NOTE — Progress Notes (Signed)
 Office: [phone]  /  Fax: [phone]  WEIGHT SUMMARY AND BIOMETRICS  Starting Date: 07/24/23  Starting Weight: 278lb   Weight Lost Since Last Visit: 0lb   Vitals Temp: 98 F (36.7 C) BP: 120/85 Pulse Rate: 78 SpO2: 98 %   Body Composition  Body Fat %: 45.8 % Fat Mass (lbs): 125 lbs Muscle Mass (lbs): 140.6 lbs Total Body Water (lbs): 98.2 lbs Visceral Fat Rating : 11   HPI  Chief Complaint: OBESITY  Amanda [name] is here to discuss her progress with her obesity treatment plan. She is on the the Category 3 Plan and states she is following her eating plan approximately 40 % of the time. She states she is exercising 0 minutes 0 times per week.  Interval History:  Since last office visit she is up 2 lb She is up 2.6 lb of muscle mass and is down 0.8 lb of body fat This gives her a net weight loss of 5 lb in the past 6 mos of medically supervised weight management She did not see weight loss on Lomaira and this was discontinued last month She previously had eye twitching from generic phentermine She is still prone to skipping due to her work schedule She is not tracking calories She craves more salty snacks  She has some work stress but a good support system She is bringing food to work She eats out a few times a week She has cut back on ETOH intake She has not added in exercise yet but has some ideas  Pharmacotherapy: metformin XR 500 mg daily  PHYSICAL EXAM:  Blood pressure 120/85, pulse 78, temperature 98 F (36.7 C), height 5\' 9"  (1.753 m), weight 273 lb (123.8 kg), SpO2 98%. Body mass index is 40.32 kg/m.  General: She is overweight, cooperative, alert, well developed, and in no acute distress. PSYCH: Has normal mood, affect and thought process.   Lungs: Normal breathing effort, no conversational dyspnea.   ASSESSMENT AND PLAN  TREATMENT PLAN FOR OBESITY:  Recommended Dietary Goals  Yamina is currently in the action stage of change. As such, her  goal is to continue weight management plan. She has agreed to keeping a food journal and adhering to recommended goals of 1600 calories and 100 g of  protein and practicing portion control and making smarter food choices, such as increasing vegetables and decreasing simple carbohydrates.  Behavioral Intervention  We discussed the following Behavioral Modification Strategies today: increasing lean protein intake to established goals, increasing fiber rich foods, avoiding skipping meals, increasing water intake , work on meal planning and preparation, work on Counselling psychologist calories using tracking application, keeping healthy foods at home, identifying sources and decreasing liquid calories, work on managing stress, creating time for self-care and relaxation, avoiding temptations and identifying enticing environmental cues, planning for success, and continue to work on maintaining a reduced calorie state, getting the recommended amount of protein, incorporating whole foods, making healthy choices, staying well hydrated and practicing mindfulness when eating..  Additional resources provided today: NA  Recommended Physical Activity Goals  Bannie has been advised to work up to 150 minutes of moderate intensity aerobic activity a week and strengthening exercises 2-3 times per week for cardiovascular health, weight loss maintenance and preservation of muscle mass.   She has agreed to Think about enjoyable ways to increase daily physical activity and overcoming barriers to exercise and Increase physical activity in their day and reduce sedentary time (increase NEAT).  Pharmacotherapy changes for the treatment  of obesity: begin Saxenda 0.6 mg daily injection Patient denies a personal or family history of pancreatitis, medullary thyroid carcinoma or multiple endocrine neoplasia type II. Recommend reviewing pen training video online. Avoid pregnancy while on Saxenda She agrees to using a period  tracking app and condoms  ASSOCIATED CONDITIONS ADDRESSED TODAY  Other Specified Feeding or Eating Disorder, Emotional Eating Behaviors Improving.  She has been working on practicing mindful eating.  She has started CBT with Dr. Dewaine Conger.  She has a good support system and has reduced her alcohol intake.  Continue to work on eating on schedule, meal planning, stress reduction, high-quality sleep at night and keeping junk food out of the house.  Class 3 severe obesity due to excess calories with body mass index (BMI) of 40.0 to 44.9 in adult, unspecified whether serious comorbidity present (HCC) -     Saxenda; Inject 0.6 mg into the skin daily.  Dispense: 15 mL; Refill: 0 She is a good candidate for use of Saxenda.  Recommend reviewing [email or web address] website for pen training information video and further information.  We have discussed potential adverse side effects.  Avoid pregnancy while on Saxenda.  Will discontinue metformin  Vitamin D deficiency Last vitamin D Lab Results  Component Value Date   VD25OH 40.5 11/20/2023  She is now off vitamin D 50,000 IU once weekly and has switched over to over-the-counter vitamin D3 2000 once daily for maintenance  Other constipation Constipation has been chronic in nature.  She is not using Linzess 72 mcg capsule daily due to loose stools.  She is not straining but is having infrequent bowel movements.  She is averaging 60 to 80 ounces of water daily.  We discussed increasing water intake to 100 ounces per day, increasing dietary fiber intake and using MiraLAX as needed  Excessive drinking alcohol Improving.  She has a good support system at home and has been able to reduce alcoholic beverages to 2-3 drinks per week. We discussed the risk of pancreatitis with heavy alcohol intake     She was informed of the importance of frequent follow up visits to maximize her success with intensive lifestyle modifications for her multiple health  conditions.   ATTESTASTION STATEMENTS:  Reviewed by clinician on day of visit: allergies, medications, problem list, medical history, surgical history, family history, social history, and previous encounter notes pertinent to obesity diagnosis.   I have personally spent 30 minutes total time today in preparation, patient care, nutritional counseling and documentation for this visit, including the following: review of clinical lab tests; review of medical tests/procedures/services.      Glennis Brink, DO DABFM, DABOM [facility] [address] [place], [place] [address] [phone]

## 2024-02-18 ENCOUNTER — Telehealth (INDEPENDENT_AMBULATORY_CARE_PROVIDER_SITE_OTHER): Admitting: Psychology

## 2024-02-18 DIAGNOSIS — F5089 Other specified eating disorder: Secondary | ICD-10-CM

## 2024-02-18 NOTE — Progress Notes (Signed)
  Office: [phone]  /  Fax: [phone]    Date: February 18, 2024  Appointment Start Time: 11:02am Duration: 19 minutes Provider: Catherene Close, Psy.D. Type of Session: Individual Therapy  Location of Patient: Home (private location) Location of Provider: Provider's Home (private office) Type of Contact: Telepsychological Visit via MyChart Video Visit  Session Content: Amanda [name] is a 28 y.o. female presenting for a follow-up appointment to address the previously established treatment goal of increasing coping skills.Today's appointment was a telepsychological visit. Amanda [name] provided verbal consent for today's telepsychological appointment and she is aware she is responsible for securing confidentiality on her end of the session. Prior to proceeding with today's appointment, Amanda [name]'s physical location at the time of this appointment was obtained as well a phone number she could be reached at in the event of technical difficulties. Amanda [name] and this provider participated in today's telepsychological service.   This provider conducted a brief check-in. Amanda [name] shared about recent events, including starting Saxenda . She explained "it has reduced the food noise a lot." Of note, she acknowledged the trigger of squelch urgency continues to pose challenges. As such, today's appointment focused on coping with the abovementioned by going through the following acceptance based approach: acknowledge, accept, alert, await, and awareness. Amanda [name] was receptive to today's appointment as evidenced by openness to sharing, responsiveness to feedback, and willingness to implement discussed strategies .  Mental Status Examination:  Appearance: neat Behavior: appropriate to circumstances Mood: neutral Affect: mood congruent Speech: WNL Eye Contact: intermittent  Psychomotor Activity: WNL Gait: unable to assess Thought Process: linear, logical, and goal directed and no evidence or endorsement of suicidal, homicidal,  and self-harm ideation, plan and intent  Thought Content/Perception: no hallucinations, delusions, bizarre thinking or behavior endorsed or observed Orientation: AAOx4 Memory/Concentration: intact Insight: good Judgment: good  Interventions:  Conducted a brief chart review Provided empathic reflections and validation Provided positive reinforcement Employed supportive psychotherapy interventions to facilitate reduced distress and to improve coping skills with identified stressors Psychoeducation provided regarding acceptance based approaches  DSM-5 Diagnosis(es): F50.89 Other Specified Feeding or Eating Disorder, Emotional Eating Behaviors  Treatment Goal & Progress: During the initial appointment with this provider, the following treatment goal was established: increase coping skills. Amanda [name] has demonstrated progress in her goal as evidenced by increased awareness of hunger patterns, increased awareness of triggers for emotional eating behaviors, and reduction in emotional eating behaviors. Amanda [name] also continues to demonstrate willingness to engage in learned skill(s).   Plan: The next appointment is scheduled for 03/24/2024 at 10am, which will be via MyChart Video Visit. The next session will focus on working towards the established treatment goal and termination. Amanda [name] will continue with her primary therapist.    Catherene Close, PsyD

## 2024-02-20 ENCOUNTER — Ambulatory Visit: Admitting: Family Medicine

## 2024-02-20 ENCOUNTER — Encounter: Payer: Self-pay | Admitting: Family Medicine

## 2024-02-20 VITALS — BP 116/81 | HR 88 | Temp 98.2°F | Ht 69.0 in | Wt 266.0 lb

## 2024-02-20 DIAGNOSIS — F5089 Other specified eating disorder: Secondary | ICD-10-CM | POA: Diagnosis not present

## 2024-02-20 DIAGNOSIS — E66812 Obesity, class 2: Secondary | ICD-10-CM

## 2024-02-20 DIAGNOSIS — F1010 Alcohol abuse, uncomplicated: Secondary | ICD-10-CM | POA: Diagnosis not present

## 2024-02-20 DIAGNOSIS — Z6839 Body mass index (BMI) 39.0-39.9, adult: Secondary | ICD-10-CM

## 2024-02-20 DIAGNOSIS — K5904 Chronic idiopathic constipation: Secondary | ICD-10-CM | POA: Diagnosis not present

## 2024-02-20 MED ORDER — SAXENDA 18 MG/3ML ~~LOC~~ SOPN: 1.8000 mg | PEN_INJECTOR | Freq: Every day | SUBCUTANEOUS | 0 refills | Status: DC

## 2024-02-20 MED ORDER — LUBIPROSTONE 8 MCG PO CAPS: 8.0000 ug | ORAL_CAPSULE | Freq: Every day | ORAL | 0 refills | Status: DC

## 2024-02-20 NOTE — Progress Notes (Signed)
 Office: [phone]  /  Fax: [phone]  WEIGHT SUMMARY AND BIOMETRICS  Starting Date: 07/24/23  Starting Weight: 278lb   Weight Lost Since Last Visit: 7lb   Vitals Temp: 98.2 F (36.8 C) BP: 116/81 Pulse Rate: 88 SpO2: 99 %   Body Composition  Body Fat %: 44.4 % Fat Mass (lbs): 118.4 lbs Muscle Mass (lbs): 140.8 lbs Total Body Water (lbs): 96.4 lbs Visceral Fat Rating : 10    HPI  Chief Complaint: OBESITY  Amanda [name] is here to discuss her progress with her obesity treatment plan. She is on the the Category 3 Plan and states she is following her eating plan approximately 60 % of the time. She states she is exercising 0 minutes 0 times per week.  Interval History:  Since last office visit she is  down 7 lb This gives her a net weight loss of 12 lb in 7 mos She is getting ready to move to a new house She has is using Equate fiber gummies for constipation She did start on Saxenda , ramped up to 1.2 mg daily She has had some nausea on occasion with improved portion control and snacking She has started CBT with Dr Amanda [name] She has cut back on ETOH intake with a reduction in desire  Pharmacotherapy: Saxenda  1.2 mg daily  PHYSICAL EXAM:  Blood pressure 116/81, pulse 88, temperature 98.2 F (36.8 C), height 5\' 9"  (1.753 m), weight 266 lb (120.7 kg), last menstrual period 02/05/2024, SpO2 99%. Body mass index is 39.28 kg/m.  General: She is overweight, cooperative, alert, well developed, and in no acute distress. PSYCH: Has normal mood, affect and thought process.   Lungs: Normal breathing effort, no conversational dyspnea.   ASSESSMENT AND PLAN  TREATMENT PLAN FOR OBESITY:  Recommended Dietary Goals  Amanda [name] is currently in the action stage of change. As such, her goal is to continue weight management plan. She has agreed to keeping a food journal and adhering to recommended goals of 1600 calories and 100+ g of  protein and practicing portion control and making  smarter food choices, such as increasing vegetables and decreasing simple carbohydrates.  Behavioral Intervention  We discussed the following Behavioral Modification Strategies today: increasing lean protein intake to established goals, increasing fiber rich foods, increasing water intake , work on meal planning and preparation, work on tracking and journaling calories using tracking application, keeping healthy foods at home, identifying sources and decreasing liquid calories, planning for success, and continue to work on maintaining a reduced calorie state, getting the recommended amount of protein, incorporating whole foods, making healthy choices, staying well hydrated and practicing mindfulness when eating..  Additional resources provided today: NA  Recommended Physical Activity Goals  Amanda [name] has been advised to work up to 150 minutes of moderate intensity aerobic activity a week and strengthening exercises 2-3 times per week for cardiovascular health, weight loss maintenance and preservation of muscle mass.   She has agreed to Think about enjoyable ways to increase daily physical activity and overcoming barriers to exercise and Increase physical activity in their day and reduce sedentary time (increase NEAT).  Pharmacotherapy changes for the treatment of obesity: increase Saxenda  to 1.8 mg daily injection  ASSOCIATED CONDITIONS ADDRESSED TODAY  Other Specified Feeding or Eating Disorder, Emotional Eating Behaviors emotional eating is under better control with the addition of Saxenda  currently at 1.2 mg once daily injection.  She has started cognitive behavioral therapy with Dr. Delaine [name].  She has a good support system at home and  is looking forward to moving into her new house with her husband.  Class 2 obesity due to excess calories with body mass index (BMI) of 39.0 to 39.9 in adult, unspecified whether serious comorbidity present -     Saxenda ; Inject 1.8 mg into the skin daily.   Dispense: 15 mL; Refill: 0 She has improved satiety on Saxenda  without adverse side effect.  Will increase to 1.8 mg once daily injection Reviewed plan to ramp up exercise frequency over the next month  Excessive drinking alcohol Improving.  She notes a reduction in desire for alcohol consumption and has been able to greatly reduce her alcohol intake with use of Saxenda .  She is aware of the risk for pancreatitis with alcohol consumption on this medication.  Continue to work on stress reduction, healthy food choices and eating on a schedule.  Chronic idiopathic constipation -     Lubiprostone ; Take 1 capsule (8 mcg total) by mouth daily with breakfast.  Dispense: 30 capsule; Refill: 0 Constipation has been an issue even prior to the start Saxenda  but has gotten worse with the addition of Saxenda .  She is working on water intake as well as fruits and vegetables.  She has previously had diarrhea from Linzess  and is taking over-the-counter fiber Gummies without resolution.  Begin Amitiza  8 mcg once daily.  Increase water intake to 64+ ounces per day and incorporate more nonstarchy vegetables.     She was informed of the importance of frequent follow up visits to maximize her success with intensive lifestyle modifications for her multiple health conditions.   ATTESTASTION STATEMENTS:  Reviewed by clinician on day of visit: allergies, medications, problem list, medical history, surgical history, family history, social history, and previous encounter notes pertinent to obesity diagnosis.   I have personally spent 30 minutes total time today in preparation, patient care, nutritional counseling and education,  and documentation for this visit, including the following: review of most recent clinical lab tests, prescribing medications/ refilling medications, reviewing medical assistant documentation, review and interpretation of bioimpedence results.     Amanda [name], D.O. DABFM, DABOM [facility] [address] [place], [place] [address] [phone]

## 2024-03-18 ENCOUNTER — Ambulatory Visit (INDEPENDENT_AMBULATORY_CARE_PROVIDER_SITE_OTHER): Admitting: Family Medicine

## 2024-03-18 ENCOUNTER — Encounter: Payer: Self-pay | Admitting: Family Medicine

## 2024-03-18 VITALS — BP 119/81 | HR 91 | Temp 97.9°F | Ht 69.0 in | Wt 267.0 lb

## 2024-03-18 DIAGNOSIS — E66812 Obesity, class 2: Secondary | ICD-10-CM

## 2024-03-18 DIAGNOSIS — E559 Vitamin D deficiency, unspecified: Secondary | ICD-10-CM | POA: Diagnosis not present

## 2024-03-18 DIAGNOSIS — K5904 Chronic idiopathic constipation: Secondary | ICD-10-CM

## 2024-03-18 DIAGNOSIS — E6609 Other obesity due to excess calories: Secondary | ICD-10-CM

## 2024-03-18 DIAGNOSIS — R632 Polyphagia: Secondary | ICD-10-CM

## 2024-03-18 DIAGNOSIS — Z6839 Body mass index (BMI) 39.0-39.9, adult: Secondary | ICD-10-CM

## 2024-03-18 MED ORDER — SAXENDA 18 MG/3ML ~~LOC~~ SOPN: 1.8000 mg | PEN_INJECTOR | Freq: Every day | SUBCUTANEOUS | 1 refills | Status: DC

## 2024-03-18 NOTE — Patient Instructions (Signed)
 Continue Saxenda  1.8 mg daily  Best of luck with upcoming move  Try to track calories most days of the week: 1600 ca/ day This should include 100-120 g of protein per day  Plan in gym workouts after your move 3 days/ wk Add in more walking before your move

## 2024-03-18 NOTE — Progress Notes (Signed)
 Office: [phone]  /  Fax: [phone]  WEIGHT SUMMARY AND BIOMETRICS  Starting Date: 07/24/23  Starting Weight: 278lb   Weight Lost Since Last Visit: 0lb   Vitals Temp: 97.9 F (36.6 C) BP: 119/81 Pulse Rate: 91 SpO2: 97 %   Body Composition  Body Fat %: 44.2 % Fat Mass (lbs): 118 lbs Muscle Mass (lbs): 141.6 lbs Total Body Water (lbs): 96.4 lbs Visceral Fat Rating : 10   HPI  Chief Complaint: OBESITY  Amanda [name] is here to discuss her progress with her obesity treatment plan. She is on the the Category 2 Plan and states she is following her eating plan approximately 50 % of the time. She states she is exercising 0 minutes 0 times per week.  Interval History:  Since last office visit she is up 1 lb She did have a gap without Saxenda  when traveling to [place] but she is back on the 1.8 mg daily She plans to get back to the gym but struggles to make the time to go She is moving to a new house at the end of the month Constipation has improved-- has not been using Amitiza  She has had more ETOH calories but not has much as prior to Saxenda  start This gives her a net weight loss of 11 lb in 7 mos of medically supervised weight management  Pharmacotherapy: Saxenda  1.8 mg daily  PHYSICAL EXAM:  Blood pressure 119/81, pulse 91, temperature 97.9 F (36.6 C), height 5\' 9"  (1.753 m), weight 267 lb (121.1 kg), last menstrual period 02/05/2024, SpO2 97%. Body mass index is 39.43 kg/m.  General: She is overweight, cooperative, alert, well developed, and in no acute distress. PSYCH: Has normal mood, affect and thought process.   Lungs: Normal breathing effort, no conversational dyspnea.   ASSESSMENT AND PLAN  TREATMENT PLAN FOR OBESITY:  Recommended Dietary Goals  Amanda [name] is currently in the action stage of change. As such, her goal is to continue weight management plan. She has agreed to keeping a food journal and adhering to recommended goals of 1600 calories and  100-120 g of  protein and practicing portion control and making smarter food choices, such as increasing vegetables and decreasing simple carbohydrates.  Behavioral Intervention  We discussed the following Behavioral Modification Strategies today: increasing lean protein intake to established goals, increasing fiber rich foods, increasing water intake , work on meal planning and preparation, work on tracking and journaling calories using tracking application, keeping healthy foods at home, identifying sources and decreasing liquid calories, practice mindfulness eating and understand the difference between hunger signals and cravings, continue to practice mindfulness when eating, planning for success, and continue to work on maintaining a reduced calorie state, getting the recommended amount of protein, incorporating whole foods, making healthy choices, staying well hydrated and practicing mindfulness when eating..  Additional resources provided today: NA  Recommended Physical Activity Goals  Amanda [name] has been advised to work up to 150 minutes of moderate intensity aerobic activity a week and strengthening exercises 2-3 times per week for cardiovascular health, weight loss maintenance and preservation of muscle mass.   She has agreed to [employer] strengthening exercises with a goal of 2-3 sessions a week  Reviewed lifestyle change goals together on AVS  Pharmacotherapy changes for the treatment of obesity: none  ASSOCIATED CONDITIONS ADDRESSED TODAY  Polyphagia Hunger and cravings have improved on Saxenda  1.8 mg daily injection without GI upset Plan to increase dose next visit if weight loss does not exceed 3 lb She  has room for improvement with dietary logging to ensure she is hitting her calorie and protein goal. Hydrate well with water and work on increasing intake of fiber with veggies >> fruit and complex carbs  Class 2 obesity due to excess calories with body mass index (BMI) of 39.0 to 39.9  in adult, unspecified whether serious comorbidity present -     Saxenda ; Inject 1.8 mg into the skin daily.  Dispense: 15 mL; Refill: 1  Chronic idiopathic constipation Improved with increasing water and fiber intake Has not needed to start Amitiza   Vitamin D  deficiency Last vitamin D  Lab Results  Component Value Date   VD25OH 40.5 11/20/2023   She is taking OTC vitamin D  Recommend adding in  womens' MVI + make sure OTC vitamin D  is 2,000 international units  daily for maintenance     She was informed of the importance of frequent follow up visits to maximize her success with intensive lifestyle modifications for her multiple health conditions.   ATTESTASTION STATEMENTS:  Reviewed by clinician on day of visit: allergies, medications, problem list, medical history, surgical history, family history, social history, and previous encounter notes pertinent to obesity diagnosis.   I have personally spent 30 minutes total time today in preparation, patient care, nutritional counseling and education,  and documentation for this visit, including the following: review of most recent clinical lab tests, prescribing medications/ refilling medications, reviewing medical assistant documentation, review and interpretation of bioimpedence results.     [name], D.O. DABFM, DABOM [facility] [address] [place], [place] [address] [phone]

## 2024-03-24 ENCOUNTER — Encounter (INDEPENDENT_AMBULATORY_CARE_PROVIDER_SITE_OTHER): Payer: Self-pay

## 2024-03-24 ENCOUNTER — Telehealth (INDEPENDENT_AMBULATORY_CARE_PROVIDER_SITE_OTHER): Admitting: Psychology

## 2024-03-24 ENCOUNTER — Telehealth (INDEPENDENT_AMBULATORY_CARE_PROVIDER_SITE_OTHER): Payer: Self-pay | Admitting: Psychology

## 2024-03-24 NOTE — Telephone Encounter (Signed)
  Office: [phone]  /  Fax: [phone]  Date of Call: Mar 24, 2024  Provider: Catherene Close, PsyD  CONTENT: This provider called Amanda [name] to check-in as she did not present for today's MyChart Video Visit appointment. A HIPAA compliant voicemail was left requesting a call back. Of note, this provider stayed on the MyChart Video Visit appointment for 5 minutes prior to signing off per the clinic's grace period policy.    PLAN: This provider will wait for Amanda [name] to call back. No further follow-up planned by this provider.

## 2024-03-24 NOTE — Progress Notes (Unsigned)
  Office: [phone]  /  Fax: [phone]    Date: Mar 24, 2024  Appointment Start Time: *** Duration: *** minutes Provider: Catherene Close, Psy.D. Type of Session: Individual Therapy  Location of Patient: {gbptloc:23249} (private location) Location of Provider: Provider's Home (private office) Type of Contact: Telepsychological Visit via MyChart Video Visit  Session Content:This provider called Amanda [name] at 10:03am as she did not present for today's appointment. A HIPAA compliant voicemail was left requesting a call back. She was observed joining shortly after. *** As such, today's appointment was initiated *** minutes late. Amanda [name] is a 28 y.o. female presenting for a follow-up appointment to address the previously established treatment goal of increasing coping skills.Today's appointment was a telepsychological visit. Amanda [name] provided verbal consent for today's telepsychological appointment and she is aware she is responsible for securing confidentiality on her end of the session. Prior to proceeding with today's appointment, Amanda [name]'s physical location at the time of this appointment was obtained as well a phone number she could be reached at in the event of technical difficulties. Amanda [name] and this provider participated in today's telepsychological service.   This provider conducted a brief check-in. *** Amanda [name] was receptive to today's appointment as evidenced by openness to sharing, responsiveness to feedback, and {gbreceptiveness:23401}.  Mental Status Examination:  Appearance: {Appearance:22431} Behavior: {Behavior:22445} Mood: {gbmood:21757} Affect: {Affect:22436} Speech: {Speech:22432} Eye Contact: {Eye Contact:22433} Psychomotor Activity: {Motor Activity:22434} Gait: {gbgait:23404} Thought Process: {thought process:22448}  Thought Content/Perception: {disturbances:22451} Orientation: {Orientation:22437} Memory/Concentration: {gbcognition:22449} Insight: {Insight:22446} Judgment:  {Insight:22446}  Interventions:  {Interventions for Progress Notes:23405}  DSM-5 Diagnosis(es): F50.89 Other Specified Feeding or Eating Disorder, Emotional Eating Behaviors  Treatment Goal & Progress: During the initial appointment with this provider, the following treatment goal was established: increase coping skills. Amanda [name] has demonstrated progress in her goal as evidenced by {gbtxprogress:22839}. Amanda [name] also {gbtxprogress2:22951}.  Plan: The next appointment is scheduled for *** at ***, which will be via MyChart Video Visit. The next session will focus on {Plan for Next Appointment:23400}.   Catherene Close, PsyD

## 2024-04-16 ENCOUNTER — Ambulatory Visit: Admitting: Family Medicine

## 2024-04-16 ENCOUNTER — Encounter: Payer: Self-pay | Admitting: Family Medicine

## 2024-04-16 VITALS — BP 112/79 | HR 79 | Temp 98.3°F | Ht 69.0 in | Wt 264.0 lb

## 2024-04-16 DIAGNOSIS — E66812 Obesity, class 2: Secondary | ICD-10-CM

## 2024-04-16 DIAGNOSIS — K5904 Chronic idiopathic constipation: Secondary | ICD-10-CM | POA: Diagnosis not present

## 2024-04-16 DIAGNOSIS — R632 Polyphagia: Secondary | ICD-10-CM

## 2024-04-16 DIAGNOSIS — E559 Vitamin D deficiency, unspecified: Secondary | ICD-10-CM

## 2024-04-16 DIAGNOSIS — R5383 Other fatigue: Secondary | ICD-10-CM | POA: Diagnosis not present

## 2024-04-16 DIAGNOSIS — Z6838 Body mass index (BMI) 38.0-38.9, adult: Secondary | ICD-10-CM

## 2024-04-16 DIAGNOSIS — E6609 Other obesity due to excess calories: Secondary | ICD-10-CM

## 2024-04-16 MED ORDER — SAXENDA 18 MG/3ML ~~LOC~~ SOPN: 2.4000 mg | PEN_INJECTOR | Freq: Every day | SUBCUTANEOUS | 1 refills | Status: DC

## 2024-04-16 MED ORDER — PEN NEEDLES 32G X 4 MM MISC: 1 refills | Status: DC

## 2024-04-16 NOTE — Progress Notes (Signed)
 Office: [phone]  /  Fax: [phone]  WEIGHT SUMMARY AND BIOMETRICS  Starting Date: 07/24/23  Starting Weight: 278lb   Weight Lost Since Last Visit: 3lb   Vitals Temp: 98.3 F (36.8 C) BP: 112/79 Pulse Rate: 79 SpO2: 99 %   Body Composition  Body Fat %: 43.2 % Fat Mass (lbs): 114 lbs Muscle Mass (lbs): 142.4 lbs Total Body Water (lbs): 95.2 lbs Visceral Fat Rating : 10    HPI  Chief Complaint: OBESITY  Amanda [name] is here to discuss her progress with her obesity treatment plan. She is on the the Category 2 Plan and states she is following her eating plan approximately 50 % of the time. She states she is exercising 30-60 minutes 1-2 times per week.  Interval History:  Since last office visit she is down 3 lb She is up 0.8 lb of muscle mass and down 4 lb of body fat since last visit  This gives her a net weight loss of 14 lb in 8 mos This is a 5% TBW loss She has felt more tired and has had more food noise lately She remains on Saxenda  1.8 mg daily She has been walking 1-2 days/ wk and has not increased due to fatigue She has a sedentary job, trying to get up more often and use a standing desk She is moved in and settled into her new house Her spouse is supportive She is working a later shift (12 pm -9p)  Pharmacotherapy: Saxenda  1.8 mg daily injection  PHYSICAL EXAM:  Blood pressure 112/79, pulse 79, temperature 98.3 F (36.8 C), height 5' 9 (1.753 m), weight 264 lb (119.7 kg), SpO2 99%. Body mass index is 38.99 kg/m.  General: She is overweight, cooperative, alert, well developed, and in no acute distress. PSYCH: Has normal mood, affect and thought process.   Lungs: Normal breathing effort, no conversational dyspnea.  ASSESSMENT AND PLAN  TREATMENT PLAN FOR OBESITY:  Recommended Dietary Goals  Amanda [name] is currently in the action stage of change. As such, her goal is to continue weight management plan. She has agreed to keeping a food journal and  adhering to recommended goals of 1600 calories and 110-130 g of  protein and practicing portion control and making smarter food choices, such as increasing vegetables and decreasing simple carbohydrates.  Behavioral Intervention  We discussed the following Behavioral Modification Strategies today: increasing lean protein intake to established goals, increasing fiber rich foods, avoiding skipping meals, increasing water intake , keeping healthy foods at home, decreasing eating out or consumption of processed foods, and making healthy choices when eating convenient foods, practice mindfulness eating and understand the difference between hunger signals and cravings, work on managing stress, creating time for self-care and relaxation, avoiding temptations and identifying enticing environmental cues, and continue to work on maintaining a reduced calorie state, getting the recommended amount of protein, incorporating whole foods, making healthy choices, staying well hydrated and practicing mindfulness when eating..  Additional resources provided today: NA  Recommended Physical Activity Goals  Amanda [name] has been advised to work up to 150 minutes of moderate intensity aerobic activity a week and strengthening exercises 2-3 times per week for cardiovascular health, weight loss maintenance and preservation of muscle mass.   She has agreed to Increase the intensity, frequency or duration of aerobic exercises   Add in evening workouts 3-4 x a day if work schedule shifts earlier  Pharmacotherapy changes for the treatment of obesity: increase Saxenda  to 2.4 mg daily injection  ASSOCIATED  CONDITIONS ADDRESSED TODAY  Polyphagia Worsening with an increase in food noise which may be due to tolerance to Saxenda  1.8 mg dose, lack of adequate protein and fiber.  Asked her to track her daily intake for a week to ensure she is getting in the right amount of calories and protein.  Increase water and fiber intake.  Increase  Saxenda  to 2.4 mg daily  Class 2 obesity due to excess calories with body mass index (BMI) of 38.0 to 38.9 in adult, unspecified whether serious comorbidity present -     Saxenda ; Inject 2.4 mg into the skin daily.  Dispense: 15 mL; Refill: 1 -     Pen Needles; Use once daily as directed  Dispense: 90 each; Refill: 1  Chronic idiopathic constipation Improving and not needing Amitiza  daily Increase water intake to 100+ oz/ day Get in 2+ servings of veggies daily and 1-2 fresh or frozen fruit servings daily  Vitamin D  deficiency Last vitamin D  Lab Results  Component Value Date   VD25OH 40.5 11/20/2023   She is taking a MVI + vitamin D  2,000 international units  daily but c/o fatigue.  Continue both supplements.   Repeat vitamin D  level in 3 mos  Other fatigue Worsening. She notes increased fatigue despite 8-9 hrs of sleep at night, taking a MVI and OTC vitamin D  daily.  She has a good support system at home but has some work stress and struggles with her later work shift with irregular eating times and 'no energy to exercise'.  Will work on switching work shift earlier to accommodate recommended meal times AND add in evening exercise at least 3 days/ wk. Add a B- complex daily.    She was informed of the importance of frequent follow up visits to maximize her success with intensive lifestyle modifications for her multiple health conditions.   ATTESTASTION STATEMENTS:  Reviewed by clinician on day of visit: allergies, medications, problem list, medical history, surgical history, family history, social history, and previous encounter notes pertinent to obesity diagnosis.   I have personally spent 30 minutes total time today in preparation, patient care, nutritional counseling and education,  and documentation for this visit, including the following: review of most recent clinical lab tests, prescribing medications/ refilling medications, reviewing medical assistant documentation, review  and interpretation of bioimpedence results.     [name], D.O. DABFM, DABOM [facility] [address] [place], [place] [address] [phone]

## 2024-04-16 NOTE — Patient Instructions (Signed)
 I would recommend: A Women's Multivitamin daily + vitamin D  2,000 international units  daily + a B- complex daily  Increase Saxenda  to 2.4 mg daily  Try to log calorie intake for a week: 1600 cal/ day  This should include 110-130 g of protein daily  Try shift change-- I will complete your paperwork

## 2024-04-24 NOTE — Progress Notes (Deleted)
 04/24/2024 Farha Dano Novell [id number] 1996/09/13  Referring provider: No ref. provider found Primary GI doctor: Dr. Federico  ASSESSMENT AND PLAN:  History of constipation 07/24/2023 normal CBC, slight elevation of ALT  Elevated LFTs with history of alcohol use 07/24/2023 ALT 37, AST 35, alk phos 54, total bilirubin 0.4, platelets 256  Patient Care Team: Patient, No Pcp Per as PCP - General (General Practice)  HISTORY OF PRESENT ILLNESS: 28 y.o. female with a past medical history listed below presents for evaluation of diarrhea, bloody stools and abdominal pain.   Last seen in the office 12/20/2021 by Dr. Federico  *** Discussed the use of AI scribe software for clinical note transcription with the patient, who gave verbal consent to proceed.  History of Present Illness            She  reports that she has quit smoking. She has never used smokeless tobacco. She reports current alcohol use. She reports that she does not use drugs.  RELEVANT GI HISTORY, IMAGING AND LABS: Results          CBC    Component Value Date/Time   WBC 8.9 07/24/2023 0816   WBC 9.4 02/26/2022 1310   RBC 5.07 07/24/2023 0816   RBC 4.72 02/26/2022 1310   HGB 14.4 07/24/2023 0816   HCT 44.9 07/24/2023 0816   PLT 256 07/24/2023 0816   MCV 89 07/24/2023 0816   MCH 28.4 07/24/2023 0816   MCH 29.4 02/26/2022 1310   MCHC 32.1 07/24/2023 0816   MCHC 34.3 02/26/2022 1310   RDW 13.3 07/24/2023 0816   LYMPHSABS 2.7 07/24/2023 0816   MONOABS 0.5 02/26/2022 1310   EOSABS 0.1 07/24/2023 0816   BASOSABS 0.1 07/24/2023 0816   Recent Labs    07/24/23 0816  HGB 14.4    CMP     Component Value Date/Time   NA 138 07/24/2023 0816   K 4.6 07/24/2023 0816   CL 101 07/24/2023 0816   CO2 23 07/24/2023 0816   GLUCOSE 99 07/24/2023 0816   GLUCOSE 105 (H) 02/26/2022 1310   BUN 13 07/24/2023 0816   CREATININE 0.87 07/24/2023 0816   CALCIUM 9.6 07/24/2023 0816   PROT 7.5 11/20/2023 1034   ALBUMIN  4.5 11/20/2023 1034   AST 17 11/20/2023 1034   ALT 20 11/20/2023 1034   ALKPHOS 56 11/20/2023 1034   BILITOT 0.5 11/20/2023 1034   GFRNONAA >60 02/26/2022 1310   GFRAA >60 05/29/2015 0645      Latest Ref Rng & Units 11/20/2023   10:34 AM 07/24/2023    8:16 AM 12/20/2021   10:02 AM  Hepatic Function  Total Protein 6.0 - 8.5 g/dL 7.5  7.4  7.6   Albumin 4.0 - 5.0 g/dL 4.5  4.3  4.3   AST 0 - 40 IU/L 17  35  12   ALT 0 - 32 IU/L 20  37  12   Alk Phosphatase 44 - 121 IU/L 56  54  43   Total Bilirubin 0.0 - 1.2 mg/dL 0.5  0.4  0.4   Bilirubin, Direct 0.00 - 0.40 mg/dL 9.82         Current Medications:    Current Outpatient Medications (Cardiovascular):    spironolactone (ALDACTONE) 50 MG tablet, Take 50 mg by mouth 2 (two) times daily.     Current Outpatient Medications (Other):    Insulin  Pen Needle (PEN NEEDLES) 32G X 4 MM MISC, Use once daily as directed   Liraglutide  -  Weight Management (SAXENDA ) 18 MG/3ML SOPN, Inject 2.4 mg into the skin daily.   lubiprostone  (AMITIZA ) 8 MCG capsule, Take 1 capsule (8 mcg total) by mouth daily with breakfast.   MAGNESIUM PO, Take by mouth.   polyethylene glycol (MIRALAX / GLYCOLAX) 17 g packet, Take 17 g by mouth daily.  Medical History:  Past Medical History:  Diagnosis Date   Chest pain    Constipation    SOB (shortness of breath)    Allergies: No Known Allergies   Surgical History:  She  has a past surgical history that includes Tonsillectomy. Family History:  Her family history includes Alcoholism in her father; Diabetes in her father; Hypertension in her father; Kidney cancer in her mother; Skin cancer in her mother.  REVIEW OF SYSTEMS  : All other systems reviewed and negative except where noted in the History of Present Illness.  PHYSICAL EXAM: There were no vitals taken for this visit. Physical Exam          [name], PA-C 8:22 AM

## 2024-04-27 ENCOUNTER — Telehealth: Payer: Self-pay | Admitting: *Deleted

## 2024-04-27 ENCOUNTER — Ambulatory Visit: Admitting: Physician Assistant

## 2024-04-27 NOTE — Telephone Encounter (Signed)
 Prior authorization done for her Saxenda  via cover my meds. Waiting on determination.

## 2024-04-28 NOTE — Telephone Encounter (Signed)
 Prior authorization for saxenda  is approved.

## 2024-04-30 NOTE — Progress Notes (Deleted)
 04/30/2024 Amanda [name] [id number] Aug 04, 1996  Referring provider: No ref. provider found Primary GI doctor: Dr. Federico  ASSESSMENT AND PLAN:  History of constipation 07/24/2023 normal CBC, slight elevation of ALT  Elevated LFTs with history of alcohol use 07/24/2023 ALT 37, AST 35, alk phos 54, total bilirubin 0.4, platelets 256  Patient Care Team: Patient, No Pcp Per as PCP - General (General Practice)  HISTORY OF PRESENT ILLNESS: 28 y.o. female with a past medical history listed below presents for evaluation of diarrhea, bloody stools and abdominal pain.   Last seen in the office 12/20/2021 by Dr. Federico  *** Discussed the use of AI scribe software for clinical note transcription with the patient, who gave verbal consent to proceed.  History of Present Illness            She  reports that she has quit smoking. She has never used smokeless tobacco. She reports current alcohol use. She reports that she does not use drugs.  RELEVANT GI HISTORY, IMAGING AND LABS: Results          CBC    Component Value Date/Time   WBC 8.9 07/24/2023 0816   WBC 9.4 02/26/2022 1310   RBC 5.07 07/24/2023 0816   RBC 4.72 02/26/2022 1310   HGB 14.4 07/24/2023 0816   HCT 44.9 07/24/2023 0816   PLT 256 07/24/2023 0816   MCV 89 07/24/2023 0816   MCH 28.4 07/24/2023 0816   MCH 29.4 02/26/2022 1310   MCHC 32.1 07/24/2023 0816   MCHC 34.3 02/26/2022 1310   RDW 13.3 07/24/2023 0816   LYMPHSABS 2.7 07/24/2023 0816   MONOABS 0.5 02/26/2022 1310   EOSABS 0.1 07/24/2023 0816   BASOSABS 0.1 07/24/2023 0816   Recent Labs    07/24/23 0816  HGB 14.4    CMP     Component Value Date/Time   NA 138 07/24/2023 0816   K 4.6 07/24/2023 0816   CL 101 07/24/2023 0816   CO2 23 07/24/2023 0816   GLUCOSE 99 07/24/2023 0816   GLUCOSE 105 (H) 02/26/2022 1310   BUN 13 07/24/2023 0816   CREATININE 0.87 07/24/2023 0816   CALCIUM 9.6 07/24/2023 0816   PROT 7.5 11/20/2023 1034   ALBUMIN  4.5 11/20/2023 1034   AST 17 11/20/2023 1034   ALT 20 11/20/2023 1034   ALKPHOS 56 11/20/2023 1034   BILITOT 0.5 11/20/2023 1034   GFRNONAA >60 02/26/2022 1310   GFRAA >60 05/29/2015 0645      Latest Ref Rng & Units 11/20/2023   10:34 AM 07/24/2023    8:16 AM 12/20/2021   10:02 AM  Hepatic Function  Total Protein 6.0 - 8.5 g/dL 7.5  7.4  7.6   Albumin 4.0 - 5.0 g/dL 4.5  4.3  4.3   AST 0 - 40 IU/L 17  35  12   ALT 0 - 32 IU/L 20  37  12   Alk Phosphatase 44 - 121 IU/L 56  54  43   Total Bilirubin 0.0 - 1.2 mg/dL 0.5  0.4  0.4   Bilirubin, Direct 0.00 - 0.40 mg/dL 9.82         Current Medications:    Current Outpatient Medications (Cardiovascular):    spironolactone (ALDACTONE) 50 MG tablet, Take 50 mg by mouth 2 (two) times daily.     Current Outpatient Medications (Other):    Insulin  Pen Needle (PEN NEEDLES) 32G X 4 MM MISC, Use once daily as directed   Liraglutide  -  Weight Management (SAXENDA ) 18 MG/3ML SOPN, Inject 2.4 mg into the skin daily.   lubiprostone  (AMITIZA ) 8 MCG capsule, Take 1 capsule (8 mcg total) by mouth daily with breakfast.   MAGNESIUM PO, Take by mouth.   polyethylene glycol (MIRALAX / GLYCOLAX) 17 g packet, Take 17 g by mouth daily.  Medical History:  Past Medical History:  Diagnosis Date   Chest pain    Constipation    SOB (shortness of breath)    Allergies: No Known Allergies   Surgical History:  She  has a past surgical history that includes Tonsillectomy. Family History:  Her family history includes Alcoholism in her father; Diabetes in her father; Hypertension in her father; Kidney cancer in her mother; Skin cancer in her mother.  REVIEW OF SYSTEMS  : All other systems reviewed and negative except where noted in the History of Present Illness.  PHYSICAL EXAM: There were no vitals taken for this visit. Physical Exam          Amanda JONELLE Coombs, PA-C 11:39 AM

## 2024-05-04 ENCOUNTER — Ambulatory Visit: Admitting: Physician Assistant

## 2024-05-14 ENCOUNTER — Ambulatory Visit: Admitting: Family Medicine

## 2024-05-27 ENCOUNTER — Ambulatory Visit: Admitting: Family Medicine

## 2024-06-03 ENCOUNTER — Ambulatory Visit: Admitting: Family Medicine

## 2024-06-03 ENCOUNTER — Encounter: Payer: Self-pay | Admitting: Family Medicine

## 2024-06-03 VITALS — BP 115/80 | HR 76 | Temp 97.8°F | Ht 69.0 in | Wt 264.0 lb

## 2024-06-03 DIAGNOSIS — E66812 Obesity, class 2: Secondary | ICD-10-CM | POA: Diagnosis not present

## 2024-06-03 DIAGNOSIS — F5089 Other specified eating disorder: Secondary | ICD-10-CM | POA: Diagnosis not present

## 2024-06-03 DIAGNOSIS — R632 Polyphagia: Secondary | ICD-10-CM

## 2024-06-03 DIAGNOSIS — F3289 Other specified depressive episodes: Secondary | ICD-10-CM | POA: Diagnosis not present

## 2024-06-03 DIAGNOSIS — Z6838 Body mass index (BMI) 38.0-38.9, adult: Secondary | ICD-10-CM

## 2024-06-03 MED ORDER — PEN NEEDLES 32G X 4 MM MISC: 1 refills | Status: AC

## 2024-06-03 MED ORDER — SAXENDA 18 MG/3ML ~~LOC~~ SOPN: 3.0000 mg | PEN_INJECTOR | Freq: Every day | SUBCUTANEOUS | 1 refills | Status: DC

## 2024-06-03 NOTE — Progress Notes (Signed)
 Office: [phone]  /  Fax: [phone]  WEIGHT SUMMARY AND BIOMETRICS  Starting Date: 07/24/23  Starting Weight: 278lb   Weight Lost Since Last Visit: 0lb   Vitals Temp: 97.8 F (36.6 C) BP: 115/80 Pulse Rate: 76 SpO2: 100 %   Body Composition  Body Fat %: 44.6 % Fat Mass (lbs): 118.2 lbs Muscle Mass (lbs): 139.2 lbs Total Body Water (lbs): 98.2 lbs Visceral Fat Rating : 10    HPI  Chief Complaint: OBESITY  Amanda [name] is here to discuss her progress with her obesity treatment plan. She is on the the Category 2 Plan and states she is following her eating plan approximately 50 % of the time. She states she is exercising 30-45 minutes 1-2 times per week.  Interval History:  Since last office visit she is down 0 lb This gives her a newt weight loss 14 lb in 10 mos of medically supervised weight management This is a 5% TBW loss She has been adjusting to her new work schedule This affects meal planning, food choices and workout schedule She is not consistent with exercise, lacking enough sleep at night Motivation is there but she struggles to make herself a priority She did increase Saxenda  to 2.4 mg daily injection without adverse SE  Copy of cat 3 / 100 cal snack list given  Pharmacotherapy: Saxenda  2.4 mg injection daily  PHYSICAL EXAM:  Blood pressure 115/80, pulse 76, temperature 97.8 F (36.6 C), height 5' 9 (1.753 m), weight 264 lb (119.7 kg), last menstrual period 05/11/2024, SpO2 100%. Body mass index is 38.99 kg/m.  General: She is overweight, cooperative, alert, well developed, and in no acute distress. PSYCH: Has normal mood, affect and thought process.   Lungs: Normal breathing effort, no conversational dyspnea.  ASSESSMENT AND PLAN  TREATMENT PLAN FOR OBESITY:  Recommended Dietary Goals  Amanda [name] is currently in the action stage of change. As such, her goal is to continue weight management plan. She has agreed to the Category 3 Plan. Copy  given  Behavioral Intervention  We discussed the following Behavioral Modification Strategies today: increasing lean protein intake to established goals, increasing fiber rich foods, increasing water intake , work on meal planning and preparation, keeping healthy foods at home, practice mindfulness eating and understand the difference between hunger signals and cravings, work on managing stress, creating time for self-care and relaxation, avoiding temptations and identifying enticing environmental cues, and continue to work on maintaining a reduced calorie state, getting the recommended amount of protein, incorporating whole foods, making healthy choices, staying well hydrated and practicing mindfulness when eating.. Reviewed dietary change goals on AVS  Additional resources provided today: NA  Recommended Physical Activity Goals  Amanda [name] has been advised to work up to 150 minutes of moderate intensity aerobic activity a week and strengthening exercises 2-3 times per week for cardiovascular health, weight loss maintenance and preservation of muscle mass.   She has agreed to Start aerobic activity with a goal of 150 minutes a week at moderate intensity.   Pharmacotherapy changes for the treatment of obesity: increase Saxenda  to 3 mg daily injection  ASSOCIATED CONDITIONS ADDRESSED TODAY  Other depression with emotional eating She has limited support at home and is adjusting to her new work schedule She is lacking sleep at night which affects her workout frequency and poor motivation to plan out meals or make healthy food choices She was seeing Dr Sharron for CBT Consider outside counseling Work on improving sleep, self care and mindful eating  Class 2 obesity due to excess calories with body mass index (BMI) of 38.0 to 38.9 in adult, unspecified whether serious comorbidity present -     Saxenda ; Inject 3 mg into the skin daily.  Dispense: 15 mL; Refill: 1 -     Pen Needles; Use once daily as  directed  Dispense: 90 each; Refill: 1 Ramp Saxenda  to 3 mg daily. Consider switching agents if covered by insurance Limit ETOH Track periods and avoid pregnancy while on Saxenda   Polyphagia Unchanged She continues to struggle with hunger/ cravings/ emotional eating/ poor food choices She is lacking adequate protein and fiber and at times skipping meals all of which are worsening polyphagia We discussed ways to fix this on her AVS Ramp Saxenda  to 3 mg daily Improve sleep time at night     She was informed of the importance of frequent follow up visits to maximize her success with intensive lifestyle modifications for her multiple health conditions.   ATTESTASTION STATEMENTS:  Reviewed by clinician on day of visit: allergies, medications, problem list, medical history, surgical history, family history, social history, and previous encounter notes pertinent to obesity diagnosis.   I have personally spent 30 minutes total time today in preparation, patient care, nutritional counseling and education,  and documentation for this visit, including the following: review of most recent clinical lab tests, prescribing medications/ refilling medications, reviewing medical assistant documentation, review and interpretation of bioimpedence results.     Amanda [name], D.O. DABFM, DABOM [facility] [address] [place], [place] [address] [phone]

## 2024-06-03 NOTE — Patient Instructions (Addendum)
 For a meal replacement: [employer] Protein shake + one fruit serving   OR in the blender: 20-30 of whey protein powder OR vanilla greek yogurt with < 8 g of sugar Lowfat milk OR [employer] Milk OR unsweetened almond milk 1 handful of frozen fruit   Remember to grocery shop / plan out your meals  Resume workouts 3 days/ wk  Resume cat 3 meal plan Aim for 7-8 hrs of sleep at night

## 2024-07-01 ENCOUNTER — Ambulatory Visit: Admitting: Family Medicine

## 2024-07-16 ENCOUNTER — Ambulatory Visit: Admitting: Family Medicine

## 2024-07-16 ENCOUNTER — Encounter: Payer: Self-pay | Admitting: Family Medicine

## 2024-07-16 VITALS — BP 118/75 | HR 79 | Temp 98.5°F | Ht 69.0 in | Wt 259.0 lb

## 2024-07-16 DIAGNOSIS — K5904 Chronic idiopathic constipation: Secondary | ICD-10-CM

## 2024-07-16 DIAGNOSIS — E66812 Obesity, class 2: Secondary | ICD-10-CM

## 2024-07-16 DIAGNOSIS — F3289 Other specified depressive episodes: Secondary | ICD-10-CM | POA: Diagnosis not present

## 2024-07-16 DIAGNOSIS — F5089 Other specified eating disorder: Secondary | ICD-10-CM

## 2024-07-16 DIAGNOSIS — Z6838 Body mass index (BMI) 38.0-38.9, adult: Secondary | ICD-10-CM

## 2024-07-16 MED ORDER — LIRAGLUTIDE -WEIGHT MANAGEMENT 18 MG/3ML ~~LOC~~ SOPN: 3.0000 mg | PEN_INJECTOR | Freq: Every day | SUBCUTANEOUS | 1 refills | Status: DC

## 2024-07-16 NOTE — Patient Instructions (Signed)
 You can add in a fiber supplement for both constipation and diarrhea Sugar free Metamucil mixed in water daily  Gym goal: 2-3 x a week Add in some outside walking 30 min goal more often!  Continue to be mindful of food choices/ portion control Aim for 90-100 g of protein daily

## 2024-07-16 NOTE — Progress Notes (Signed)
 Office: [phone]  /  Fax: [phone]  WEIGHT SUMMARY AND BIOMETRICS  Starting Date: 07/24/23  Starting Weight: 278lb   Weight Lost Since Last Visit: 5lb   Vitals Temp: 98.5 F (36.9 C) BP: 118/75 Pulse Rate: 79 SpO2: 97 %   Body Composition  Body Fat %: 43.5 % Fat Mass (lbs): 113 lbs Muscle Mass (lbs): 139.4 lbs Total Body Water (lbs): 95 lbs Visceral Fat Rating : 10    HPI  Chief Complaint: OBESITY  Amanda [name] is here to discuss her progress with her obesity treatment plan. She is on the the Category 3 Plan and states she is following her eating plan approximately 45 % of the time. She states she is exercising 45 minutes 2 times per week.  Interval History:  Since last office visit she is down 5 lb She is up 0.2 lb of muscle mass and down 5.2 lb of body fat since last visit This gives her a net weight loss of 19 lb in 11 mos of medically supervised weight management This is a 6.8% TBW loss She has been cautious about her food choices on Saxenda  3 mg daily injection She has a little nausea but no vomiting This has improved without meal skipping She is getting in fruits and veggies She is mindful of lean protein intake She started going to the gym 2 days/ wk this week  Pharmacotherapy: Saxenda  3 mg once daily injection  PHYSICAL EXAM:  Blood pressure 118/75, pulse 79, temperature 98.5 F (36.9 C), height 5' 9 (1.753 m), weight 259 lb (117.5 kg), SpO2 97%. Body mass index is 38.25 kg/m.  General: She is overweight, cooperative, alert, well developed, and in no acute distress. PSYCH: Has normal mood, affect and thought process.   Lungs: Normal breathing effort, no conversational dyspnea.   ASSESSMENT AND PLAN  TREATMENT PLAN FOR OBESITY:  Recommended Dietary Goals  Amanda [name] is currently in the action stage of change. As such, her goal is to continue weight management plan. She has agreed to keeping a food journal and adhering to recommended goals of  1600 calories and 100 g of  protein and practicing portion control and making smarter food choices, such as increasing vegetables and decreasing simple carbohydrates.  Behavioral Intervention  We discussed the following Behavioral Modification Strategies today: increasing lean protein intake to established goals, increasing fiber rich foods, increasing water intake , work on meal planning and preparation, work on tracking and journaling calories using tracking application, keeping healthy foods at home, identifying sources and decreasing liquid calories, planning for success, continue to work on maintaining a reduced calorie state, getting the recommended amount of protein, incorporating whole foods, making healthy choices, staying well hydrated and practicing mindfulness when eating., and increase protein intake, fibrous foods (25 grams per day for women, 30 grams for men) and water to improve satiety and decrease hunger signals. .  Additional resources provided today: NA  Recommended Physical Activity Goals  Amanda [name] has been advised to work up to 150 minutes of moderate intensity aerobic activity a week and strengthening exercises 2-3 times per week for cardiovascular health, weight loss maintenance and preservation of muscle mass.   She has agreed to Increase the intensity, frequency or duration of strengthening exercises  and Increase the intensity, frequency or duration of aerobic exercises    Pharmacotherapy changes for the treatment of obesity: none  ASSOCIATED CONDITIONS ADDRESSED TODAY  Other depression with emotional eating Improving Continue to work on stress reduction and mindful eating Food  noise and emotional eating has much improved on Saxenda  3 mg daily injection  Class 2 obesity due to excess calories with body mass index (BMI) of 38.0 to 38.9 in adult, unspecified whether serious comorbidity present -     Liraglutide  -Weight Management; Inject 3 mg into the skin daily.   Dispense: 15 mL; Refill: 1 Continue use of period tracking app, not trying to conceive but is not on birth control Agrees to natural family planning + condom use Has adequate weight loss, retaining lean body mass Ramp up exercise time  Chronic idiopathic constipation Improved and has held Amitiza  due to rise in diarrhea on Saxenda  likely triggered by dietary fat intake.  She can begin metamucil daily for both diarrhea and constipation and improve water intake.      She was informed of the importance of frequent follow up visits to maximize her success with intensive lifestyle modifications for her multiple health conditions.   ATTESTASTION STATEMENTS:  Reviewed by clinician on day of visit: allergies, medications, problem list, medical history, surgical history, family history, social history, and previous encounter notes pertinent to obesity diagnosis.   I have personally spent 30 minutes total time today in preparation, patient care, nutritional counseling and education,  and documentation for this visit, including the following: review of most recent clinical lab tests, prescribing medications/ refilling medications, reviewing medical assistant documentation, review and interpretation of bioimpedence results.     Darice Haddock, D.O. DABFM, DABOM [facility] [address] [place], [place] [address] [phone]

## 2024-07-28 ENCOUNTER — Telehealth: Payer: Self-pay | Admitting: *Deleted

## 2024-07-28 NOTE — Telephone Encounter (Signed)
Prior authorization done via cover my meds for patients Saxenda. Waiting on determination.

## 2024-07-29 NOTE — Telephone Encounter (Signed)
 Prior authorization approved for patients Saxenda .

## 2024-08-20 ENCOUNTER — Ambulatory Visit: Admitting: Family Medicine

## 2024-09-07 ENCOUNTER — Ambulatory Visit: Admitting: Family Medicine

## 2024-09-15 ENCOUNTER — Ambulatory Visit (INDEPENDENT_AMBULATORY_CARE_PROVIDER_SITE_OTHER): Admitting: Family Medicine

## 2024-09-15 ENCOUNTER — Encounter: Payer: Self-pay | Admitting: Family Medicine

## 2024-09-15 VITALS — BP 130/85 | HR 79 | Temp 97.5°F | Ht 69.0 in | Wt 259.0 lb

## 2024-09-15 DIAGNOSIS — Z7689 Persons encountering health services in other specified circumstances: Secondary | ICD-10-CM

## 2024-09-15 DIAGNOSIS — F3289 Other specified depressive episodes: Secondary | ICD-10-CM | POA: Diagnosis not present

## 2024-09-15 DIAGNOSIS — F5089 Other specified eating disorder: Secondary | ICD-10-CM | POA: Diagnosis not present

## 2024-09-15 DIAGNOSIS — Z6838 Body mass index (BMI) 38.0-38.9, adult: Secondary | ICD-10-CM

## 2024-09-15 DIAGNOSIS — R42 Dizziness and giddiness: Secondary | ICD-10-CM

## 2024-09-15 DIAGNOSIS — E66812 Obesity, class 2: Secondary | ICD-10-CM | POA: Diagnosis not present

## 2024-09-15 NOTE — Progress Notes (Signed)
 Office: [phone]  /  Fax: [phone]  WEIGHT SUMMARY AND BIOMETRICS  Starting Date: 07/24/23  Starting Weight: 278lb   Weight Lost Since Last Visit: 0lb   Vitals Temp: (!) 97.5 F (36.4 C) BP: 130/85 Pulse Rate: 79 SpO2: 100 %   Body Composition  Body Fat %: 44.8 % Fat Mass (lbs): 116.2 lbs Muscle Mass (lbs): 135.8 lbs Total Body Water (lbs): 97.2 lbs Visceral Fat Rating : 10     HPI  Chief Complaint: OBESITY  Amanda [name] is here to discuss her progress with her obesity treatment plan. She is on the the Category 2 Plan and states she is following her eating plan approximately 30 % of the time. She states she is exercising 45 minutes 1-2 times per week.   Interval History:  Since last office visit she is down 0 lb  She is down 3.6 pounds of muscle mass and up 3.2 pounds of body fat since last visit This gives her a net weight loss of 19 lb in 13 mos of medically supervised weight management This is a 6.8% total body weight loss She did ramp up Liraglutide  to 3 mg daily and she is having some bouts of dizziness She tends to skip meals due to work and school, usually breakfast She hasn't had as much time to go to the gym, feeling overwhelmed with her schedule between work and school She struggles with food prep/ planning, overeating starches  Pharmacotherapy: Saxenda  3 mg once daily injection  PHYSICAL EXAM:  Blood pressure 130/85, pulse 79, temperature (!) 97.5 F (36.4 C), height 5' 9 (1.753 m), weight 259 lb (117.5 kg), SpO2 100%. Body mass index is 38.25 kg/m.  General: She is overweight, cooperative, alert, well developed, and in no acute distress. PSYCH: Has normal mood, flat affect and thought process.   Lungs: Normal breathing effort, no conversational dyspnea.   ASSESSMENT AND PLAN  TREATMENT PLAN FOR OBESITY:  Recommended Dietary Goals  Amanda [name] is currently in the action stage of change. As such, her goal is to continue weight management  plan. She has agreed to the Category 3 Plan.  Behavioral Intervention  We discussed the following Behavioral Modification Strategies today: increasing lean protein intake to established goals, increasing fiber rich foods, increasing water intake , work on meal planning and preparation, keeping healthy foods at home, identifying sources and decreasing liquid calories, decreasing eating out or consumption of processed foods, and making healthy choices when eating convenient foods, practice mindfulness eating and understand the difference between hunger signals and cravings, work on managing stress, creating time for self-care and relaxation, avoiding temptations and identifying enticing environmental cues, and continue to practice mindfulness when eating.  Additional resources provided today: NA  Recommended Physical Activity Goals  Amanda [name] has been advised to work up to 150 minutes of moderate intensity aerobic activity a week and strengthening exercises 2-3 times per week for cardiovascular health, weight loss maintenance and preservation of muscle mass.   She has agreed to Start aerobic activity with a goal of 150 minutes a week at moderate intensity.  We set a gym goal for 2 to 3 days a week and/or adding in some outdoor walking  Pharmacotherapy changes for the treatment of obesity: Discontinue Saxenda  due to lack of weight loss From prior authorization for Amanda [name]  ASSOCIATED CONDITIONS ADDRESSED TODAY  Dizziness New.  This occurred after injecting Saxenda  on 1 occasion.  It was likely due to meal skipping.  We discussed the importance before she started  Saxenda  to avoid meal skipping but she has been busy between work and school and has been more prone to meal skipping.  We discussed the risk for hypoglycemia.  She has been hydrating well with water.  She is hemodynamically stable today.  Will discontinue Saxenda  and work on eating on a schedule.  Class 2 obesity due to excess calories with  body mass index (BMI) of 38.0 to 38.9 in adult, unspecified whether serious comorbidity present Stable.  She has had a slowdown in weight loss having lost 6.8% of her total body weight in 13 months of medically supervised weight management With her BMI of 38, we discussed additional treatment options including weight loss surgery. She has room for improvement with improving her relationship with food and recommend following up with Dr. Sharron for CBT Will run prior authorization for [facility] Will discontinue Saxenda  given episode of dizziness and lack of weight loss.  Encounter to establish care -     Ambulatory referral to [facility] Referral placed for family medicine.  Patient is encouraged to establish care.  Other depression with emotional eating Worsening with depressed mood.  She has a good support system at home but feels overwhelmed between work and school.  She will set up follow-up with Dr. Sharron for CBT, continuing additional counseling if needed.  Discussed with upcoming PCP visit.     She was informed of the importance of frequent follow up visits to maximize her success with intensive lifestyle modifications for her multiple health conditions.   ATTESTASTION STATEMENTS:  Reviewed by clinician on day of visit: allergies, medications, problem list, medical history, surgical history, family history, social history, and previous encounter notes pertinent to obesity diagnosis.   I have personally spent 31 minutes total time today in preparation, patient care, nutritional counseling and education,  and documentation for this visit, including the following: review of most recent clinical lab tests, prescribing medications/ refilling medications, reviewing medical assistant documentation, review and interpretation of bioimpedence results.     Amanda [name], D.O. DABFM, DABOM [facility] [address] [place], [place] [address] [phone]

## 2024-09-21 ENCOUNTER — Telehealth (INDEPENDENT_AMBULATORY_CARE_PROVIDER_SITE_OTHER): Admitting: Psychology

## 2024-09-21 ENCOUNTER — Ambulatory Visit: Payer: Self-pay

## 2024-09-21 DIAGNOSIS — F5089 Other specified eating disorder: Secondary | ICD-10-CM

## 2024-09-21 DIAGNOSIS — F4320 Adjustment disorder, unspecified: Secondary | ICD-10-CM | POA: Diagnosis not present

## 2024-09-21 NOTE — Telephone Encounter (Signed)
 FYI Only or Action Required?: FYI only for provider: appointment scheduled on 1.19.26.  Patient was last seen in primary care on 09/15/2024 by Waylan Darice BRAVO, DO.  Called Nurse Triage reporting Anxiety.  Symptoms began several months ago.  Interventions attempted: Nothing.  Symptoms are: gradually worsening.  Triage Disposition: See PCP When Office is Open (Within 3 Days)  Patient/caregiver understands and will follow disposition?: Yes     Copied from CRM #[id number]. Topic: Clinical - Red Word Triage >> Sep 21, 2024 12:56 PM Dedra B wrote: [facility] that prompted transfer to Nurse Triage: Pt anxiety symptoms. She wishes to establish with B[facility] Gave. Warm transfer to NT Reason for Disposition  [1] Anxiety symptoms AND [2] has not been evaluated for this by doctor (or NP/PA)  Answer Assessment - Initial Assessment Questions Pt called stating that she is looking to set up with a primary care and Benton Gave is who one of her other doctors referred her to. She states that she has been feeling overwhelmed with work, school, and things going on her life. She states she is having increased anxiety. She states she does have dizziness and lightheadedness at times. Denies any currently. She states it happens if she gets up too fast or moving around too quickly.  Pt scheduled for next available with NP Gave in January. Rn gave call back instructions. RN advised pt to go to ER if she begins to have any thoughts of harming herself.    1. CONCERN: Did anything happen that prompted you to call today?      Feeling overwhlemed 2. ANXIETY SYMPTOMS: Can you describe how you (your loved one; patient) have been feeling? (e.g., tense, restless, panicky, anxious, keyed up, overwhelmed, sense of impending doom).      overwhelmed 3. ONSET: How long have you been feeling this way? (e.g., hours, days, weeks)     1-2 months 4. FUNCTIONAL IMPAIRMENT: How have these feelings affected your ability to  do daily activities? Have you had more difficulty than usual doing your normal daily activities? (e.g., getting better, same, worse; self-care, school, work, interactions)     States it is starting to affect her daily life 5. HISTORY: Have you felt this way before? Have you ever been diagnosed with an anxiety problem in the past? (e.g., generalized anxiety disorder, panic attacks, PTSD). If Yes, ask: How was this problem treated? (e.g., medicines, counseling, etc.)     History of depression 6. RISK OF HARM - SUICIDAL IDEATION: Do you ever have thoughts of hurting or killing yourself? If Yes, ask:  Do you have these feelings now? Do you have a plan on how you would do this?     denies 7. THERAPIST: Do you have a counselor or therapist? If Yes, ask: What is their name?     Has a referral for one but has not had it set up yet 8. POTENTIAL TRIGGERS: Do you drink caffeinated beverages (e.g., coffee, colas, teas), and how much daily? Do you drink alcohol or use any drugs? Have you started any new medicines recently?       Drinks alcohol on the weekends, denies drug use.  9. PATIENT SUPPORT: Who is with you now? Who do you live with? Do you have family or friends who you can talk to?        Family and friends 10. OTHER SYMPTOMS: Do you have any other symptoms? (e.g., feeling depressed, trouble concentrating, trouble sleeping, trouble breathing, palpitations or fast heartbeat, chest  pain, sweating, nausea, or diarrhea)       denies 11. PREGNANCY: Is there any chance you are pregnant? When was your last menstrual period?       denies  Protocols used: Anxiety and Panic Attack-A-AH

## 2024-09-21 NOTE — Progress Notes (Signed)
  Office: [phone]  /  Fax: [phone]    Date: September 21, 2024  Appointment Start Time: 12:00pm Duration: 31 minutes Provider: Wyatt Fire, Psy.D. Type of Session: Individual Therapy  Location of Patient: Home (private location) Location of Provider: Provider's Home (private office) Type of Contact: Telepsychological Visit via MyChart Video Visit  Session Content: Amanda [name] is a 28 y.o. female presenting for a follow-up appointment to address the previously established treatment goal of increasing coping skills.Today's appointment was a telepsychological visit. [name] provided verbal consent for today's telepsychological appointment and she is aware she is responsible for securing confidentiality on her end of the session. Prior to proceeding with today's appointment, [name]'s physical location at the time of this appointment was obtained as well a phone number she could be reached at in the event of technical difficulties. [name] and this provider participated in today's telepsychological service.   This provider conducted a brief check-in. [name] provided updates since the last appointment (02/18/2024) with this provider, including working FT and taking four 8-week courses. She noted she is feeling overwhelmed. Associated thoughts and feelings were explored and processed. She indicated she is seeking a leave of absence per her manager's recommendation. This provider reviewed her role with the clinic and the time lapse since the last appointment. Additionally, this provider shared she will be leaving the practice mid-December and discussed referral options as [name] is no longer seeing a therapist via [facility]. Associated thoughts and feelings were processed. Remainder of session focused further on eating habits. She described her eating habits as random. She described limited protein and water intake. Thus, she was engaged in problem solving. 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 Overall, [name] was receptive to today's appointment as evidenced by openness to sharing, responsiveness to feedback, and willingness to implement discussed strategies .  Mental Status Examination:  Appearance: neat Behavior: appropriate to circumstances Mood: anxious Affect: mood congruent Speech: WNL Eye Contact: appropriate Psychomotor Activity: WNL Gait: unable to assess Thought Process: linear, logical, and goal directed and no evidence or endorsement of suicidal, homicidal, and self-harm ideation, plan and intent  Thought Content/Perception: no hallucinations, delusions, bizarre thinking or behavior endorsed or observed Orientation: AAOx4 Memory/Concentration: intact Insight: fair Judgment: fair  Interventions:  Conducted a brief chart review Provided empathic reflections and validation Provided positive reinforcement Employed supportive psychotherapy interventions to facilitate reduced distress and to improve coping skills with identified stressors Engaged patient in problem solving  DSM-5 Diagnosis(es): F50.89 Other Specified Feeding or Eating Disorder, Emotional Eating Behaviors and F43.20 Adjustment Disorder, Unspecified   Treatment Goal & Progress: During the initial appointment with this provider, the following treatment goal was established: increase coping skills. [name] has demonstrated progress in her goal as evidenced by increased awareness of hunger patterns, increased awareness of triggers for emotional eating behaviors, and reduction in emotional eating behaviors. [name] also continues to demonstrate willingness to engage in learned skill(s).   Plan: The next appointment is scheduled for 09/29/2024 at 12pm, which will be via MyChart Video Visit. The next session will focus on working towards the established treatment goal. Additionally, she provided verbal consent  for this provider to place a referral with [facility].     Wyatt Fire, PsyD

## 2024-09-22 ENCOUNTER — Telehealth: Payer: Self-pay | Admitting: *Deleted

## 2024-09-22 NOTE — Telephone Encounter (Signed)
 Prior authorization done via cover my meds for patients. [place]. Waiting on determination.

## 2024-09-28 NOTE — Telephone Encounter (Signed)
 Received notification through Cover My Meds that Amanda [name] has been approved.  Message from plan: Your PA request has been approved. Additional information will be provided in the approval communication. (Message [id number]). Authorization Expiration Date: April 22, 2025.

## 2024-09-29 ENCOUNTER — Encounter: Payer: Self-pay | Admitting: Urgent Care

## 2024-09-29 ENCOUNTER — Ambulatory Visit: Admitting: Urgent Care

## 2024-09-29 ENCOUNTER — Telehealth (INDEPENDENT_AMBULATORY_CARE_PROVIDER_SITE_OTHER): Admitting: Psychology

## 2024-09-29 VITALS — BP 125/85 | HR 88 | Ht 69.0 in | Wt 267.0 lb

## 2024-09-29 DIAGNOSIS — F4320 Adjustment disorder, unspecified: Secondary | ICD-10-CM

## 2024-09-29 DIAGNOSIS — F411 Generalized anxiety disorder: Secondary | ICD-10-CM | POA: Diagnosis not present

## 2024-09-29 DIAGNOSIS — L659 Nonscarring hair loss, unspecified: Secondary | ICD-10-CM

## 2024-09-29 DIAGNOSIS — F5089 Other specified eating disorder: Secondary | ICD-10-CM

## 2024-09-29 DIAGNOSIS — F4310 Post-traumatic stress disorder, unspecified: Secondary | ICD-10-CM

## 2024-09-29 DIAGNOSIS — G4730 Sleep apnea, unspecified: Secondary | ICD-10-CM

## 2024-09-29 DIAGNOSIS — Z6839 Body mass index (BMI) 39.0-39.9, adult: Secondary | ICD-10-CM | POA: Diagnosis not present

## 2024-09-29 MED ORDER — PRAZOSIN HCL 1 MG PO CAPS: 1.0000 mg | ORAL_CAPSULE | Freq: Every day | ORAL | 0 refills | Status: DC

## 2024-09-29 MED ORDER — FLUOXETINE HCL 20 MG PO CAPS: 20.0000 mg | ORAL_CAPSULE | Freq: Every day | ORAL | 0 refills | Status: DC
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
## 2024-09-29 NOTE — Progress Notes (Signed)
  Office: [phone]  /  Fax: [phone]    Date: September 29, 2024  Appointment Start Time: 12:01pm Duration: 22 minutes Provider: Wyatt Fire, Psy.D. Type of Session: Individual Therapy  Location of Patient: Parked in car at work (address obtained; private location) Location of Provider: Provider's Home (private office) Type of Contact: Telepsychological Visit via MyChart Video Visit  Session Content: Sun is a 28 y.o. female presenting for a follow-up appointment to address the previously established treatment goal of increasing coping skills.Today's appointment was a telepsychological visit. [name] provided verbal consent for today's telepsychological appointment and she is aware she is responsible for securing confidentiality on her end of the session. Prior to proceeding with today's appointment, [name]'s physical location at the time of this appointment was obtained as well a phone number she could be reached at in the event of technical difficulties. [name] and this provider participated in today's telepsychological service.   This provider conducted a brief check-in. [name] shared she is scheduled with a new PCP today and she will be initiating therapeutic services with [facility] on December 15th. Notably, she indicated an increase in water intake. Due to ongoing stressors, today's appointment focused on self-care. Psychoeducation regarding the importance of self-care utilizing the oxygen mask metaphor was provided. Discussed faux self-care vs. real self-care. Remainder of today's appointment focused on identifying things that fulfill her and are congruent to her values as she indicated she ha[d] no idea. Thus, the miracle question was utilized to explore the abovementioned further. Overall, [name] was receptive to today's appointment as evidenced by openness to sharing, responsiveness to feedback, and willingness to continue engaging in learned skills.  Mental Status Examination:   Appearance: neat Behavior: appropriate to circumstances Mood: neutral Affect: mood congruent Speech: WNL Eye Contact: appropriate Psychomotor Activity: WNL Gait: unable to assess Thought Process: linear, logical, and goal directed and no evidence or endorsement of suicidal, homicidal, and self-harm ideation, plan and intent  Thought Content/Perception: no hallucinations, delusions, bizarre thinking or behavior endorsed or observed Orientation: AAOx4 Memory/Concentration: intact Insight: fair Judgment: fair  Interventions:  Conducted a brief chart review Provided empathic reflections and validation Reviewed content from the previous session Provided positive reinforcement Employed supportive psychotherapy interventions to facilitate reduced distress and to improve coping skills with identified stressors Utilized miracle question Psychoeducation regarding self-care  Provided positive reinforcement   DSM-5 Diagnosis(es): F50.89 Other Specified Feeding or Eating Disorder, Emotional Eating Behaviors and F43.20 Adjustment Disorder, Unspecified   Treatment Goal & Progress: During the initial appointment with this provider, the following treatment goal was established: increase coping skills. [name] demonstrated progress in her goal as evidenced by increased awareness of hunger patterns, increased awareness of triggers for emotional eating behaviors, and reduction in emotional eating behaviors. [name] also continues to demonstrate willingness to engage in learned skill(s).   Plan: Today is the last the appointment with this provider, as [name] will be initiating therapeutic services with Tawni Louder with [facility] on October 12, 2024.    Wyatt Fire, PsyD

## 2024-09-29 NOTE — Patient Instructions (Addendum)
 Please start taking fluoxetine 20mg  daily. Best to take at night as it can make you feel sleepy. Take prazosin at night - this can help with nightmares.  Please discuss contrave and/or naltrexone with your weight loss clinic. This helps with carb craving or emotional eating.  Please discuss finasteride / propecia 1mg  which helps with hair.  Please look up books on Amazon for Cognitive behavioral therapy. This can help!  Please follow up in 4-6 weeks.

## 2024-09-30 ENCOUNTER — Encounter: Payer: Self-pay | Admitting: Urgent Care

## 2024-09-30 DIAGNOSIS — F5089 Other specified eating disorder: Secondary | ICD-10-CM | POA: Insufficient documentation

## 2024-09-30 DIAGNOSIS — G4730 Sleep apnea, unspecified: Secondary | ICD-10-CM | POA: Insufficient documentation

## 2024-09-30 DIAGNOSIS — F4310 Post-traumatic stress disorder, unspecified: Secondary | ICD-10-CM | POA: Insufficient documentation

## 2024-09-30 DIAGNOSIS — Z6839 Body mass index (BMI) 39.0-39.9, adult: Secondary | ICD-10-CM | POA: Insufficient documentation

## 2024-09-30 DIAGNOSIS — F411 Generalized anxiety disorder: Secondary | ICD-10-CM | POA: Insufficient documentation

## 2024-09-30 DIAGNOSIS — L659 Nonscarring hair loss, unspecified: Secondary | ICD-10-CM | POA: Insufficient documentation

## 2024-10-05 ENCOUNTER — Encounter: Payer: Self-pay | Admitting: Urgent Care

## 2024-10-05 ENCOUNTER — Telehealth: Payer: Self-pay

## 2024-10-05 ENCOUNTER — Other Ambulatory Visit: Payer: Self-pay | Admitting: Family Medicine

## 2024-10-05 MED ORDER — WEGOVY 0.25 MG/0.5ML ~~LOC~~ SOAJ: 0.2500 mg | SUBCUTANEOUS | 0 refills | Status: DC

## 2024-10-05 NOTE — Telephone Encounter (Signed)
 Patient came into office to drop off [place] paperwork, informed patient that we will contact her when forms are ready to pick up, please advise thanks.

## 2024-10-07 ENCOUNTER — Telehealth: Payer: Self-pay | Admitting: Urgent Care

## 2024-10-07 NOTE — Telephone Encounter (Signed)
 Copied from CRM #[id number]. Topic: General - Other >> Oct 07, 2024  4:20 PM Drema MATSU wrote: Reason for CRM: Patient wants to know if her paperwork for Leave of Absence has been completed before she comes and pick it up. Please call patient.

## 2024-10-12 ENCOUNTER — Telehealth (INDEPENDENT_AMBULATORY_CARE_PROVIDER_SITE_OTHER): Admitting: Psychology

## 2024-10-12 ENCOUNTER — Ambulatory Visit: Admitting: Family Medicine

## 2024-10-12 ENCOUNTER — Ambulatory Visit: Admitting: Licensed Clinical Social Worker

## 2024-10-12 DIAGNOSIS — F4322 Adjustment disorder with anxiety: Secondary | ICD-10-CM
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
## 2024-10-20 ENCOUNTER — Ambulatory Visit (INDEPENDENT_AMBULATORY_CARE_PROVIDER_SITE_OTHER): Admitting: Licensed Clinical Social Worker

## 2024-10-20 DIAGNOSIS — F4322 Adjustment disorder with anxiety: Secondary | ICD-10-CM

## 2024-10-20 NOTE — Progress Notes (Signed)
 [facility] Counselor/Therapist Progress Note  Patient ID: Amanda [name], MRN: [id number]   Date: 10/20/2024  Time Spent: 4:00  pm - 4:59 pm : 59 Minutes  Treatment Type: Individual Therapy.  Reported Symptoms: Patient appeared exasperated and emotionally activated at session start; became more regulated as session progressed. Affect congruent with reported stressors. Thought process coherent; insight intact. Actively engaged in discussion, reflective, and receptive to clinical feedback and exploration.  Mental Status Exam: Appearance:  Well Groomed     Behavior: Appropriate  Motor: Normal  Speech/Language:  Normal Rate  Affect: Appropriate  Mood: anxious  Thought process: circumstantial  Thought content:   WNL  Sensory/Perceptual disturbances:   WNL  Orientation: oriented to person, place, and time/date  Attention: Fair  Concentration: Fair  Memory: WNL  Fund of knowledge:  Fair  Insight:   Fair  Judgment:  Fair  Impulse Control: Fair   Risk Assessment: Danger to Self:  No Self-injurious Behavior: No Danger to Others: No Duty to Warn:no Physical Aggression / Violence:No  Access to Firearms a concern: No  Gang Involvement:No   Subjective:   Amanda [name] participated from car, via video and consented to treatment. Therapist participated from home office. I discussed the limitations of evaluation and management by telemedicine and the availability of in person appointments. The patient expressed understanding and agreed to proceed. Amanda [name] reviewed the events of the past week. Patient presents for second session focused on goal clarification. Reports feeling significantly overwhelmed since last session, describing heightened stimulation and symptoms resembling a panic episode. Ongoing irritation related to frequent contact with future mother-in-law, noting difficulty being transparent due to respect, fear, and belief that behaviors are unintentional. Patient  reports increased distress related to fathers recent alcohol binge, hospitalization, early discharge against medical advice, and rapid return to work. Expresses frustration with being positioned as a support person by fathers wife and desire to distance herself while acknowledging continued family contact during holidays. Patient reports concern about pattern of minimizing distress and prematurely disengaging from therapy when feeling temporarily improved.     We reviewed numerous treatment approaches including CBT and Solution focused therapy. Psych-education regarding the Amanda [name] diagnosis of No diagnosis found. was provided during the session. We discussed Amanda [name]'s goals treatment goals which include defer to next session as patient shared new information and more information is needed to ensure clinical fit or referral. Amanda [name] provided verbal approval of the treatment plan.   Interventions: Psycho-education & Goal Setting.   Diagnosis:  Adjustment disorder with anxiety   Psychiatric Treatment: No , N/A   Treatment Plan:  Client Abilities/Strengths Amanda [name] is open to sessions.   Support System: Family and Friends  Health And Safety Inspector  Treatment Level Weekly  Symptoms  Emotional overload   (Status: declined) overwhelmed   (Status: declined)  Goals:   Thera aPatient experiencing acute role strain and emotional overload related to family dynamics, boundary challenges, caregiving expectations, and occupational/academic demands. Panic symptoms improved with medication, though emotional overwhelm persists. Clinical themes include boundary diffusion, fear of conflict, caregiver fatigue, and difficulty prioritizing self-needs. Patient demonstrates growing insight into areas requiring intervention and readiness for structured goal setting. Session focused on identifying primary areas of emotional bleed to determine treatment priorities.  Discussed three potential focus areas:  Premarital counseling referral (outside current therapist scope) to address partner communication and future in-law boundaries. Possible referral for family/substance-use-related support regarding father, if patient chooses to pursue intervention-based work. Continued individual therapy  within scope to address emotional regulation, stress management, career exploration, and healthy boundary development. Patient will use holiday period to observe family dynamics and gather additional information to inform next steps. Continue weekly therapy. Next session scheduled for first week of January. Treatment plan signed and available on s-drive:  Yes    Target Date: 11/04/24 Frequency: Weekly  Progress: 0 Modality: individual    Therapist will provide referrals for additional resources as appropriate.  Therapist will provide psycho-education regarding Amanda [name] diagnosis and corresponding treatment approaches and interventions. Licensed Clinical Mental Health Counselor, Tawni Louder, [facility] will support the patient's ability to achieve the goals identified. will employ CBT, BA, Problem-solving, Solution Focused, Mindfulness,  coping skills, & other evidenced-based practices will be used to promote progress towards healthy functioning to help manage decrease symptoms associated with her diagnosis.   Reduce overall level, frequency, and intensity of the feelings of depression, anxiety and panic evidenced by decreased self abandonment from 6 to 7 days/week to 0 to 1 days/week per client report for at least 3 consecutive months. Verbally express understanding of the relationship between feelings of depression, anxiety and their impact on thinking patterns and behaviors. Verbalize an understanding of the role that distorted thinking plays in creating fears, excessive worry, and ruminations.    (Keilyn participated in the creation of the treatment  plan)    Tawni Louder, LCMHC

## 2024-10-23 ENCOUNTER — Encounter: Payer: Self-pay | Admitting: Licensed Clinical Social Worker

## 2024-10-26 ENCOUNTER — Ambulatory Visit: Admitting: Family Medicine

## 2024-11-02 ENCOUNTER — Ambulatory Visit: Admitting: Urgent Care

## 2024-11-02 VITALS — BP 113/77 | HR 81 | Ht 69.0 in | Wt 270.0 lb

## 2024-11-02 DIAGNOSIS — F4310 Post-traumatic stress disorder, unspecified: Secondary | ICD-10-CM

## 2024-11-02 DIAGNOSIS — G4709 Other insomnia: Secondary | ICD-10-CM

## 2024-11-02 DIAGNOSIS — Z6839 Body mass index (BMI) 39.0-39.9, adult: Secondary | ICD-10-CM

## 2024-11-02 DIAGNOSIS — F411 Generalized anxiety disorder: Secondary | ICD-10-CM

## 2024-11-02 NOTE — Progress Notes (Unsigned)
 "  Established Patient Office Visit  Subjective:  Patient ID: Amanda [name], female    DOB: Sep 29, 1996  Age: 29 y.o. MRN: [id number]  Chief Complaint  Patient presents with   Follow-up    mood    HPI  Patient Active Problem List   Diagnosis Date Noted   Generalized anxiety disorder 09/30/2024   PTSD (post-traumatic stress disorder) 09/30/2024   Other specified eating disorder 09/30/2024   BMI 39.0-39.9,adult 09/30/2024   Hair loss 09/30/2024   Sleep apnea treated with continuous positive airway pressure (CPAP) 09/30/2024   Excessive drinking alcohol 09/23/2023   Vitamin D  deficiency 08/07/2023   Elevated ALT measurement 08/07/2023   Other fatigue 07/24/2023   SOBOE (shortness of breath on exertion) 07/24/2023   Other constipation 07/24/2023   Depression 07/24/2023   Depression screen 07/24/2023   BMI 40.0-44.9, adult (HCC) 07/24/2023   Morbid obesity (HCC) 07/24/2023   Polyphagia 07/10/2023   Obesity, Class III, BMI 40-49.9 (morbid obesity) (HCC) 07/10/2023   Past Medical History:  Diagnosis Date   Anxiety    Chest pain    Constipation    Depression    Sleep apnea    SOB (shortness of breath)    Past Surgical History:  Procedure Laterality Date   TONSILLECTOMY     Social History[1]    ROS: as noted in HPI  Objective:     BP 113/77   Pulse 81   Ht 5' 9 (1.753 m)   Wt 270 lb (122.5 kg)   SpO2 99%   BMI 39.87 kg/m  BP Readings from Last 3 Encounters:  11/02/24 113/77  09/29/24 125/85  09/15/24 130/85   Wt Readings from Last 3 Encounters:  11/02/24 270 lb (122.5 kg)  09/29/24 267 lb (121.1 kg)  09/15/24 259 lb (117.5 kg)      Physical Exam   No results found for any visits on 11/02/24.  Last CBC Lab Results  Component Value Date   WBC 8.9 07/24/2023   HGB 14.4 07/24/2023   HCT 44.9 07/24/2023   MCV 89 07/24/2023   MCH 28.4 07/24/2023   RDW 13.3 07/24/2023   PLT 256 07/24/2023   Last metabolic panel Lab  Results  Component Value Date   GLUCOSE 99 07/24/2023   NA 138 07/24/2023   K 4.6 07/24/2023   CL 101 07/24/2023   CO2 23 07/24/2023   BUN 13 07/24/2023   CREATININE 0.87 07/24/2023   EGFR 94 07/24/2023   CALCIUM 9.6 07/24/2023   PROT 7.5 11/20/2023   ALBUMIN 4.5 11/20/2023   LABGLOB 3.1 07/24/2023   BILITOT 0.5 11/20/2023   ALKPHOS 56 11/20/2023   AST 17 11/20/2023   ALT 20 11/20/2023   ANIONGAP 6 02/26/2022   Last lipids Lab Results  Component Value Date   CHOL 174 07/24/2023   HDL 52 07/24/2023   LDLCALC 95 07/24/2023   TRIG 157 (H) 07/24/2023   CHOLHDL 3.3 07/24/2023   Last hemoglobin A1c Lab Results  Component Value Date   HGBA1C 5.5 07/24/2023   Last thyroid functions Lab Results  Component Value Date   TSH 2.420 07/24/2023   T3TOTAL 107 07/24/2023   FREET4 1.24 07/24/2023   Last vitamin D  Lab Results  Component Value Date   VD25OH 40.5 11/20/2023   Last vitamin B12 and Folate Lab Results  Component Value Date   VITAMINB12 848 07/24/2023   FOLATE 12.0 07/24/2023      11/02/2024    3:42 PM 09/29/2024  3:19 PM 08/01/2018    8:43 AM  Depression screen PHQ 2/9  Decreased Interest 2 2 2   Down, Depressed, Hopeless 2 1 1   PHQ - 2 Score 4 3 3   Altered sleeping 3 2 0  Tired, decreased energy 1 1 2   Change in appetite 2 2 2   Feeling bad or failure about yourself  2 2 1   Trouble concentrating 1 1 1   Moving slowly or fidgety/restless 0 1 0  Suicidal thoughts 1 0 0  PHQ-9 Score 14 12 9    Difficult doing work/chores Somewhat difficult Very difficult      Data saved with a previous flowsheet row definition      11/02/2024    3:42 PM 09/29/2024    3:19 PM 08/01/2018    8:44 AM  GAD 7 : Generalized Anxiety Score  Nervous, Anxious, on Edge 1 3 2   Control/stop worrying 1 3 1   Worry too much - different things 2 3 1   Trouble relaxing 2 3 1   Restless 0 1 0  Easily annoyed or irritable 3 3 2   Afraid - awful might happen 3 3 2   Total GAD 7 Score 12 19 9    Anxiety Difficulty Somewhat difficult Very difficult         The ASCVD Risk score (Arnett DK, et al., 2019) failed to calculate for the following reasons:   The 2019 ASCVD risk score is only valid for ages 33 to 37   * - Cholesterol units were assumed  Assessment & Plan:  There are no diagnoses linked to this encounter.   No follow-ups on file.   Benton LITTIE Gave, PA      [1] Social History Tobacco Use   Smoking status: Former   Smokeless tobacco: Never  Vaping Use   Vaping status: Never Used  Substance Use Topics   Alcohol use: Yes    Alcohol/week: 12.0 standard drinks of alcohol    Types: 2 Glasses of wine, 10 Shots of liquor per week    Comment: social   Drug use: Never  "

## 2024-11-02 NOTE — Patient Instructions (Addendum)
 Magnesium glycinate 200mg  nightly Start melatonin 10mg  nightly  Continue the fluoxetine  as prescribed

## 2024-11-04 ENCOUNTER — Ambulatory Visit: Admitting: Family Medicine

## 2024-11-04 ENCOUNTER — Encounter: Payer: Self-pay | Admitting: Licensed Clinical Social Worker

## 2024-11-04 ENCOUNTER — Ambulatory Visit: Admitting: Licensed Clinical Social Worker

## 2024-11-04 ENCOUNTER — Encounter: Payer: Self-pay | Admitting: Urgent Care

## 2024-11-04 ENCOUNTER — Encounter: Payer: Self-pay | Admitting: Family Medicine

## 2024-11-04 VITALS — BP 122/85 | HR 91 | Temp 98.0°F | Ht 69.0 in | Wt 258.0 lb

## 2024-11-04 DIAGNOSIS — G4709 Other insomnia: Secondary | ICD-10-CM | POA: Insufficient documentation

## 2024-11-04 DIAGNOSIS — F411 Generalized anxiety disorder: Secondary | ICD-10-CM

## 2024-11-04 DIAGNOSIS — F3289 Other specified depressive episodes: Secondary | ICD-10-CM | POA: Diagnosis not present

## 2024-11-04 DIAGNOSIS — E66812 Obesity, class 2: Secondary | ICD-10-CM | POA: Diagnosis not present

## 2024-11-04 DIAGNOSIS — F5089 Other specified eating disorder: Secondary | ICD-10-CM | POA: Diagnosis not present

## 2024-11-04 DIAGNOSIS — F4322 Adjustment disorder with anxiety: Secondary | ICD-10-CM

## 2024-11-04 DIAGNOSIS — Z6838 Body mass index (BMI) 38.0-38.9, adult: Secondary | ICD-10-CM | POA: Diagnosis not present

## 2024-11-04 MED ORDER — WEGOVY 0.5 MG/0.5ML ~~LOC~~ SOAJ: 0.5000 mg | SUBCUTANEOUS | 0 refills | Status: AC

## 2024-11-04 NOTE — Progress Notes (Addendum)
 [facility] Counselor/Therapist Progress Note  Patient ID: Amanda Amanda [name], MRN: [id number]    Date: 11/04/2024  Time Spent: 3:10  pm - 4:03 pm : 53 Minutes  Treatment Type: Individual Therapy.  Reported Symptoms: Patient appeared engaged, reflective, and emotionally regulated during session. Demonstrated insight into boundary-setting and personal limitations. Actively participated in discussion, asked appropriate questions, and was receptive to psychoeducation regarding therapy supports and how to search for providers. Affect congruent with content; no acute distress observed.  Mental Status Exam: Appearance:  Casual     Behavior: Appropriate  Motor: Normal  Speech/Language:  Normal Rate  Affect: Appropriate  Mood: normal  Thought process: normal  Thought content:   WNL  Sensory/Perceptual disturbances:   WNL  Orientation: oriented to person, place, and time/date  Attention: Good  Concentration: Good  Memory: WNL  Fund of knowledge:  Good  Insight:   Good  Judgment:  Good  Impulse Control: Good   Risk Assessment: Danger to Self:  No Self-injurious Behavior: No Danger to Others: No Duty to Warn:no Physical Aggression / Violence:No  Access to Firearms a concern: No  Gang Involvement:No   Subjective:   Amanda Amanda [name] participated from home, via video, and consented to treatment. I discussed the limitations of evaluation and management by telemedicine and the availability of in person appointments. The patient expressed understanding and agreed to proceed.  Therapist participated from home office.  Amanda [name] reviewed the events of the past week.      Interventions: Mindfulness Meditation and Psycho-education/Bibliotherapy  Amanda [name]:  Adjustment disorder with anxiety  Psychiatric Treatment: No , N/A  Treatment Plan:  Client Abilities/Strengths Amanda Amanda [name] is open to sessions.    Support System: Family and Friends  Pharmacist, Community of Needs Amanda Amanda [name] would like to begin therapy services and focus on her goal of focusing on individual therapy for emotional regulation, stress management, career exploration, and healthy boundary development. Ive framed it in a way thats measurable and realistic within an 8-week timeframe Patient reports resolution regarding relationship with father who is struggling with substance abuse by choosing to distance herself temporarily. Patient identified sister as primary point of contact with father, stating she needs time to focus on her own emotional capacity. Patient reports remaining in a relationship with father but is defining boundaries around connection and frequency. Patient shared discussion with fianc regarding couples therapy; fianc is not ready for couples counseling but is open to pursuing individual therapy. Patient expressed relief after communicating needs and stated desire to focus on self-care, emotional regulation, stress management, and personal growth at this time.  Treatment Level Weekly  Symptoms  Emotional distress related to family substance use and relational stressors   (Status: maintained) Difficulty managing emotional capacity and stress when navigating interpersonal boundaries   (Status: maintained)  Goals:   Amanda ePatient shows improved insight and boundary development related to family substance use dynamics. Progress noted in assertive communication and prioritization of emotional well-being. Patient is motivated for treatment and agrees to goal-setting focused on emotional regulation, stress management, career exploration, and healthy boundaries. No safety concerns identified at this time. Continue biweekly individual therapy with a focus on emotional regulation, stress management, boundary development, and self-exploration. Treatment will support the patient in maintaining healthy family boundaries and navigating relational  stressors, with ongoing psychoeducation and therapeutic support as she prioritizes personal growth. Treatment goals will be reviewed by 12/30/2024.   Target Date: 11/04/24 Frequency: Weekly  Progress: 0 Modality: individual  Therapist will provide referrals for additional resources as appropriate.  Therapist will provide psycho-education regarding Amanda Amanda [name] and corresponding treatment approaches and interventions. Licensed Clinical Mental Health Counselor, Tawni Louder, [facility] will support the patient's ability to achieve the goals identified. will employ CBT, BA, Problem-solving, Solution Focused, Mindfulness,  coping skills, & other evidenced-based practices will be used to promote progress towards healthy functioning to help manage decrease symptoms associated with her Amanda [name].   Reduce overall level, frequency, and intensity of the feelings of depression, anxiety and panic evidenced by decreased lack of boundaries and assertive communication from 6 to 7 days/week to 0 to 1 days/week per client report for at least 3 consecutive months. Verbally express understanding of the relationship between feelings of depression, anxiety and their impact on thinking patterns and behaviors. Verbalize an understanding of the role that distorted thinking plays in creating fears, excessive worry, and ruminations.  (Amanda Amanda [name] participated in the creation of the treatment plan)   Tawni Louder, LCMHC   Behavioral Health Treatment Plan   Name:Amanda Amanda [name]   MRN: [id number]   Treatment Plan Development Date: 11/04/24   Strengths: Supportive Relationships  Supports: Significant Other   Client Statement of Needs: Patient requires individual therapy to strengthen emotional regulation and stress management skills, develop and maintain healthy interpersonal boundaries, and support self-exploration while navigating family substance use dynamics and relational stressors. Treatment is  needed to enhance coping capacity, promote personal growth, and improve overall emotional well-being.   Treatment Level:Bi Weekly  Client Treatment Preferences:Virtual   Amanda [name]: Adjustment Disorder  Adjustment disorder with anxiety   Symptoms:  The development of emotional or behavioral symptoms in response to an identifiable stressor(s) occurring within 3 months of the onset of the stressor(s).  Goals:  Reduce anxiety and stress using cognitive and behavioral techniques to manage symptoms. and Improve coping skills by learning effective ways to adapt and manage anxiety.  Objectives: Target Date For All Objectives: 12/30/24  Enhance problem-solving abilities and develop skills to address and resolve challenges. and Practice mindfulness  Progress Documentation:   Progressing  Interventions:  Mindfulness Meditation and Solution-Oriented/Positive Psychology     Expected duration of treatment: 8-10 weeks Solution Focused Brief Therapy  Party responsible for implementation of interventions: Patent and Provider.   This plan has been reviewed and created by the following participants: Patient and Provider   A new plan will be created at least every 12 months.  The patient fully participated in the development of treatment plan with the clinician and verbally consents to such treatment.   Patient Treatment Plan Signature Obtained: No, pending signature via MyChart.   Tawni Louder, LCMHC

## 2024-11-04 NOTE — Patient Instructions (Addendum)
 Check out [employer] for protein oats  Great job with changes!  Aim for gym workouts 2 days/ wk  Make time for meal planning/ grocery shopping  Check out [facility] [employer] for recipe ideas  Go up on Wegovy  0.5 mg weekly

## 2024-11-04 NOTE — Progress Notes (Signed)
 "  Office: [phone]  /  Fax: [phone]  WEIGHT SUMMARY AND BIOMETRICS  Starting Date: 07/24/23  Starting Weight: 278lb   Weight Lost Since Last Visit: 1lb   Vitals Temp: 98 F (36.7 C) BP: 122/85 Pulse Rate: 91 SpO2: 98 %   Body Composition  Body Fat %: 44.5 % Fat Mass (lbs): 115.2 lbs Muscle Mass (lbs): 136.4 lbs Total Body Water (lbs): 95.2 lbs Visceral Fat Rating : 10    HPI  Chief Complaint: OBESITY  Meilani is here to discuss her progress with her obesity treatment plan. She is on the the Category 2 Plan and states she is following her eating plan approximately 30 % of the time. She states she is exercising 30 minutes 2-3 times per week.  Interval History:  Since last office visit she is down 1 lb This gives her a net weight loss of 20 lb in 20 months of medically supervised weight management This is a 7% TBW loss She did establish care with Benton Gave PA for primary care and is on a treatment plan for anxiety She finished out Saxenda , took one week and then started Wegovy  She started Wegovy  0.25 mg 4 weeks ago She has times that she is still hungry She is prioritizing eating all 3 meals She is in counseling She cut back on her course load for school and is going back to work  Pharmacotherapy: Wegovy  0.25 mg weekly  PHYSICAL EXAM:  Blood pressure 122/85, pulse 91, temperature 98 F (36.7 C), height 5' 9 (1.753 m), weight 258 lb (117 kg), SpO2 98%. Body mass index is 38.1 kg/m.  General: She is overweight, cooperative, alert, well developed, and in no acute distress. PSYCH: Has normal mood, affect and thought process.   Lungs: Normal breathing effort, no conversational dyspnea.   ASSESSMENT AND PLAN  TREATMENT PLAN FOR OBESITY:  Recommended Dietary Goals  Byrd is currently in the action stage of change. As such, her goal is to continue weight management plan. She has agreed to the Category 3 Plan.  Behavioral Intervention  We  discussed the following Behavioral Modification Strategies today: increasing lean protein intake to established goals, increasing fiber rich foods, avoiding skipping meals, increasing water intake , work on meal planning and preparation, keeping healthy foods at home, work on managing stress, creating time for self-care and relaxation, avoiding temptations and identifying enticing environmental cues, continue to practice mindfulness when eating, and planning for success.  Additional resources provided today: NA  Recommended Physical Activity Goals  Raven has been advised to work up to 150 minutes of moderate intensity aerobic activity a week and strengthening exercises 2-3 times per week for cardiovascular health, weight loss maintenance and preservation of muscle mass.   She has agreed to [employer] strengthening exercises with a goal of 2-3 sessions a week  and Start aerobic activity with a goal of 150 minutes a week at moderate intensity.  Continue gym workouts with fiance with a goal of 2 days/ wk  Pharmacotherapy changes for the treatment of obesity: increase Wegovy  to 0.5 mg weekly  ASSOCIATED CONDITIONS ADDRESSED TODAY  Generalized anxiety disorder Improving.  She is in counseling and has responded well to new start fluoxetine  with her PCP.  She is taking fewer classes this semester and feels less stressed.  We discussed the importance of self care and sleep in weight management.  Continue current plan of care and f/u with PCP for further management.  Obesity, Class II, BMI 35-39.9 Improving on Wegovy   with less SE than previous use of Saxenda  She is starting to feel improved satiety and is motivated to work on prescribed meal plan with suggestions provided on AVS.  We set a gym goal.    Patient was counseled on the importance of maintaining healthy lifestyle habits, including balanced nutrition, regular physical activity, and behavioral modifications, while taking antiobesity medication.   Patient verbalized understanding that medication is an adjunct to, not a replacement for, lifestyle changes and that the long-term success and weight maintenance depend on continued adherence to these strategies.  -     Wegovy ; Inject 0.5 mg into the skin once a week.  Dispense: 2 mL; Refill: 0  BMI 38.0-38.9,adult  Other depression with emotional eating Improving She has improvements in food noise on Wegovy  and is doing much better with meal planning and mindful eating.  Continue counseling and keep junk food out of the house.  With rising dose of Wegovy , anticipate further improvements in food impulse control with no need for adding Naltrexone.   She was informed of the importance of frequent follow up visits to maximize her success with intensive lifestyle modifications for her multiple health conditions.   ATTESTASTION STATEMENTS:  Reviewed by clinician on day of visit: allergies, medications, problem list, medical history, surgical history, family history, social history, and previous encounter notes pertinent to obesity diagnosis.   I have personally spent 30 minutes total time today in preparation, patient care, nutritional counseling and education,  and documentation for this visit, including the following: review of most recent clinical lab tests, prescribing medications/ refilling medications, reviewing medical assistant documentation, review and interpretation of bioimpedence results.     Darice Haddock, D.O. DABFM, DABOM [facility] [address] [place], [place] [address] [phone] "

## 2024-11-16 ENCOUNTER — Ambulatory Visit: Admitting: Urgent Care

## 2024-11-18 ENCOUNTER — Ambulatory Visit: Admitting: Licensed Clinical Social Worker

## 2024-11-18 ENCOUNTER — Ambulatory Visit: Admitting: Urgent Care

## 2024-11-18 DIAGNOSIS — F4322 Adjustment disorder with anxiety: Secondary | ICD-10-CM

## 2024-11-18 NOTE — Progress Notes (Signed)
 [facility] Counselor/Therapist Progress Note  Patient ID: Amanda [name], MRN: [id number]    Date: 11/18/24  Time Spent: 3:02  pm - 3:20 pm : 18 Minutes  Treatment Type: Individual Therapy.  Reported Symptoms: Patient presented calm, engaged, and reflective. Mood euthymic; affect appropriate and congruent. Thought process logical and goal directed. No signs of acute distress observed.  Mental Status Exam: Appearance:  Casual     Behavior: Appropriate  Motor: Normal  Speech/Language:  Normal Rate  Affect: Appropriate  Mood: normal  Thought process: normal  Thought content:   WNL  Sensory/Perceptual disturbances:   WNL  Orientation: oriented to person, place, and time/date  Attention: Good  Concentration: Good  Memory: WNL  Fund of knowledge:  Good  Insight:   Good  Judgment:  Good  Impulse Control: Good   Risk Assessment: Danger to Self:  No Self-injurious Behavior: No Danger to Others: No Duty to Warn:no Physical Aggression / Violence:No  Access to Firearms a concern: No  Gang Involvement:No   Subjective:   [employer] Dennie participated from home, via video, and consented to treatment. I discussed the limitations of evaluation and management by telemedicine and the availability of in person appointments. The patient expressed understanding and agreed to proceed.  Therapist participated from home office.  Augusta reviewed the events of the past week. Patient reports that at the time of scheduling therapy she was experiencing emotional overwhelm; however, by the time services began, primary stressors had largely resolved. Patient expressed ambivalence about ongoing need for therapy at this time. She reports improvement in setting and maintaining boundaries with her father who struggles with substance use, with her fianc and his co-dependent relationship with his mother, and reports progress toward professional development goals. Patient states she feels stable  and requests to pause services, with intent to return if future stressors arise.    Interventions: Insight-Oriented  Diagnosis:  Adjustment disorder with anxiety  Psychiatric Treatment: No , N/A  Treatment Plan:  Client Abilities/Strengths Raylen is open to sessions.    Support System: Family and Friends  [employer] Officer of Needs [name] would like to pause sessions as she believes her immediate triggers have been revolved and would like to return if need be in the future.     Treatment Level Paused TBD by patient return if need  Symptoms  History of emotional overwhelm during periods of acute life stress   (Status: maintained) Interpersonal stress related to boundary challenges with family and significant others   (Status: improved)  Goals:   [name] Patient demonstrates improved emotional regulation and effective boundary setting. No current symptoms indicating need for ongoing treatment at this time. Patient stable with adequate coping skills and insight. No suicidal or homicidal ideation endorsed. Mutually agreed to pause therapy services at this time. Patient informed she may return to treatment as needed if life stressors increase. No follow-up appointment scheduled; next session TBD per patient reque   Target Date: 12/30/24 Frequency: Paused TBD  Progress: 0 Modality: individual    Therapist will provide referrals for additional resources as appropriate.  Therapist will provide psycho-education regarding Beyla's diagnosis and corresponding treatment approaches and interventions. Licensed Clinical Mental Health Counselor, Tawni Louder, [facility] will support the patient's ability to achieve the goals identified. will employ CBT, BA, Problem-solving, Solution Focused, Mindfulness,  coping skills, & other evidenced-based practices will be used to promote progress towards healthy functioning to help manage decrease symptoms  associated with her diagnosis.  Reduce overall level, frequency, and intensity of the feelings of depression, anxiety and panic evidenced by decreased lack of boundaries and assertive communication  from 6 to 7 days/week to 0 to 1 days/week per client report for at least 3 consecutive months. Verbally express understanding of the relationship between feelings of depression, anxiety and their impact on thinking patterns and behaviors. Verbalize an understanding of the role that distorted thinking plays in creating fears, excessive worry, and ruminations.  (Doloris participated in the creation of the treatment plan)   Tawni Louder, LCMHC

## 2024-11-25 ENCOUNTER — Ambulatory Visit: Admitting: Urgent Care

## 2024-11-26 ENCOUNTER — Ambulatory Visit: Admitting: Family Medicine

## 2024-12-03 ENCOUNTER — Ambulatory Visit: Admitting: Urgent Care

## 2024-12-03 ENCOUNTER — Encounter: Payer: Self-pay | Admitting: Urgent Care

## 2024-12-03 VITALS — BP 112/77 | HR 87 | Ht 69.0 in | Wt 266.0 lb

## 2024-12-03 DIAGNOSIS — L659 Nonscarring hair loss, unspecified: Secondary | ICD-10-CM

## 2024-12-03 DIAGNOSIS — G4709 Other insomnia: Secondary | ICD-10-CM

## 2024-12-03 DIAGNOSIS — F411 Generalized anxiety disorder: Secondary | ICD-10-CM

## 2024-12-03 MED ORDER — ALPRAZOLAM 0.5 MG PO TABS: 0.5000 mg | ORAL_TABLET | Freq: Two times a day (BID) | ORAL | 0 refills | Status: AC | PRN

## 2024-12-03 MED ORDER — FLUOXETINE HCL 20 MG PO CAPS: 20.0000 mg | ORAL_CAPSULE | Freq: Every day | ORAL | 1 refills | Status: AC

## 2024-12-03 NOTE — Patient Instructions (Addendum)
 Please continue on the fluoxetine  until August. Take xanax  as needed for panic attacks. Use very seldomly. This is sedating.  Follow up with me in 6 mo

## 2024-12-03 NOTE — Progress Notes (Signed)
 "  Established Patient Office Visit  Subjective:  Patient ID: Amanda [name], female    DOB: 23-Nov-1995  Age: 29 y.o. MRN: [id number]  Chief Complaint  Patient presents with   Follow-up    Anxiety; reports nausea with meds    HPI  Discussed the use of AI scribe software for clinical note transcription with the patient, who gave verbal consent to proceed.  History of Present Illness   Amanda [name] is a 29 year old female who presents with emotional difficulties and anxiety.  She has been experiencing anxiety and some depression, with anxiety being more prominent. She is currently on fluoxetine  and feels it is helping, although is concerned it makes her feel numb. She was out of work for several weeks while the medication adjusted, which was a very appropriate step for her. She felt that this time allowed for her to also focus on her therapy to get the help she needs.  Pt still having significant sleeping issues, which worsens her fatigue. She also feels that at times she is having severe panic, although this is rare. She has used PRN benzos in the past and was inquiring about a small supply for emergencies. She has used them primarily in the past for flights, and has an upcoming trip planned.  Pt does remain on wegovy  as well and has an upcoming appt with dermatology.       Patient Active Problem List   Diagnosis Date Noted   Other insomnia 11/04/2024   Generalized anxiety disorder 09/30/2024   PTSD (post-traumatic stress disorder) 09/30/2024   Other specified eating disorder 09/30/2024   BMI 39.0-39.9,adult 09/30/2024   Hair loss 09/30/2024   Sleep apnea treated with continuous positive airway pressure (CPAP) 09/30/2024   Excessive drinking alcohol 09/23/2023   Vitamin D  deficiency 08/07/2023   Elevated ALT measurement 08/07/2023   Other fatigue 07/24/2023   SOBOE (shortness of breath on exertion) 07/24/2023   Other constipation 07/24/2023   Depression 07/24/2023    Depression screen 07/24/2023   BMI 40.0-44.9, adult (HCC) 07/24/2023   Morbid obesity (HCC) 07/24/2023   Polyphagia 07/10/2023   Obesity, Class III, BMI 40-49.9 (morbid obesity) (HCC) 07/10/2023   Past Medical History:  Diagnosis Date   Anxiety    Chest pain    Constipation    Depression    Sleep apnea    SOB (shortness of breath)    Past Surgical History:  Procedure Laterality Date   TONSILLECTOMY     Social History[1]    ROS: as noted in HPI  Objective:     BP 112/77   Pulse 87   Ht 5' 9 (1.753 m)   Wt 266 lb (120.7 kg)   SpO2 97%   BMI 39.28 kg/m  BP Readings from Last 3 Encounters:  12/03/24 112/77  11/04/24 122/85  11/02/24 113/77   Wt Readings from Last 3 Encounters:  12/03/24 266 lb (120.7 kg)  11/04/24 258 lb (117 kg)  11/02/24 270 lb (122.5 kg)      Physical Exam Vitals and nursing note reviewed.  Constitutional:      General: She is not in acute distress.    Appearance: Normal appearance. She is not ill-appearing, toxic-appearing or diaphoretic.  HENT:     Head: Normocephalic and atraumatic.  Eyes:     General: No scleral icterus.       Right eye: No discharge.        Left eye: No discharge.     Extraocular  Movements: Extraocular movements intact.     Pupils: Pupils are equal, round, and reactive to light.  Cardiovascular:     Rate and Rhythm: Normal rate.  Pulmonary:     Effort: Pulmonary effort is normal. No respiratory distress.  Skin:    General: Skin is warm and dry.     Coloration: Skin is not jaundiced.     Findings: No bruising, erythema or rash.     Comments: Traction alopecia  Neurological:     General: No focal deficit present.     Mental Status: She is alert and oriented to person, place, and time.     Gait: Gait normal.  Psychiatric:        Mood and Affect: Mood normal.        Behavior: Behavior normal.      No results found for any visits on 12/03/24.  Last CBC Lab Results  Component Value Date   WBC 8.9  07/24/2023   HGB 14.4 07/24/2023   HCT 44.9 07/24/2023   MCV 89 07/24/2023   MCH 28.4 07/24/2023   RDW 13.3 07/24/2023   PLT 256 07/24/2023   Last metabolic panel Lab Results  Component Value Date   GLUCOSE 99 07/24/2023   NA 138 07/24/2023   K 4.6 07/24/2023   CL 101 07/24/2023   CO2 23 07/24/2023   BUN 13 07/24/2023   CREATININE 0.87 07/24/2023   EGFR 94 07/24/2023   CALCIUM 9.6 07/24/2023   PROT 7.5 11/20/2023   ALBUMIN 4.5 11/20/2023   LABGLOB 3.1 07/24/2023   BILITOT 0.5 11/20/2023   ALKPHOS 56 11/20/2023   AST 17 11/20/2023   ALT 20 11/20/2023   ANIONGAP 6 02/26/2022   Last lipids Lab Results  Component Value Date   CHOL 174 07/24/2023   HDL 52 07/24/2023   LDLCALC 95 07/24/2023   TRIG 157 (H) 07/24/2023   CHOLHDL 3.3 07/24/2023   Last hemoglobin A1c Lab Results  Component Value Date   HGBA1C 5.5 07/24/2023   Last thyroid functions Lab Results  Component Value Date   TSH 2.420 07/24/2023   T3TOTAL 107 07/24/2023   FREET4 1.24 07/24/2023   Last vitamin D  Lab Results  Component Value Date   VD25OH 40.5 11/20/2023   Last vitamin B12 and Folate Lab Results  Component Value Date   VITAMINB12 848 07/24/2023   FOLATE 12.0 07/24/2023        12/03/2024    8:47 AM 11/02/2024    3:42 PM 09/29/2024    3:19 PM 08/01/2018    8:43 AM  Depression screen PHQ 2/9  Decreased Interest 1 2 2 2   Down, Depressed, Hopeless 2 2 1 1   PHQ - 2 Score 3 4 3 3   Altered sleeping 3 3 2  0  Tired, decreased energy 1 1 1 2   Change in appetite 1 2 2 2   Feeling bad or failure about yourself  1 2 2 1   Trouble concentrating 1 1 1 1   Moving slowly or fidgety/restless 0 0 1 0  Suicidal thoughts 0 1 0 0  PHQ-9 Score 10 14 12 9    Difficult doing work/chores Somewhat difficult Somewhat difficult Very difficult      Data saved with a previous flowsheet row definition      12/03/2024    8:48 AM 11/02/2024    3:42 PM 09/29/2024    3:19 PM 08/01/2018    8:44 AM  GAD 7 : Generalized  Anxiety Score  Nervous, Anxious, on Edge 2  1  3  2    Control/stop worrying 1 1  3  1    Worry too much - different things 1 2  3  1    Trouble relaxing 0 2  3  1    Restless 0 0  1  0   Easily annoyed or irritable 2 3  3  2    Afraid - awful might happen 2 3  3  2    Total GAD 7 Score 8 12 19 9   Anxiety Difficulty Somewhat difficult Somewhat difficult Very difficult      Data saved with a previous flowsheet row definition      The ASCVD Risk score (Arnett DK, et al., 2019) failed to calculate for the following reasons:   The 2019 ASCVD risk score is only valid for ages 35 to 32   * - Cholesterol units were assumed  Assessment & Plan:  Hair loss  Generalized anxiety disorder -     FLUoxetine  HCl; Take 1 capsule (20 mg total) by mouth daily.  Dispense: 90 capsule; Refill: 1 -     ALPRAZolam ; Take 1 tablet (0.5 mg total) by mouth 2 (two) times daily as needed for anxiety.  Dispense: 20 tablet; Refill: 0  Other insomnia  Assessment and Plan    Generalized anxiety disorder Significant improvement with fluoxetine . Discussed potential dose reduction or switch due to personality concerns. Emphasized medication's role in preventing rebound anxiety and normalizing serotonin. Cautioned against Xanax  overuse. - Continue fluoxetine  until August for nine months total. - Prescribed Xanax  for situational anxiety, limit to twice weekly. - Consider medication switch if side effects persist.  Major depressive disorder Improvement noted, but persistent tiredness and sleep issues affect scores. Discussed fluoxetine 's role in managing depression and preventing rebound. - Continue fluoxetine  until August for nine months total. - Monitor symptoms and adjust treatment as needed.  Insomnia Difficulty sleeping contributes to fatigue and affects depression scores. Discussed magnesium glycinate and melatonin benefits. - Recommend magnesium glycinate and melatonin. - Monitor sleep patterns and adjust  treatment as needed.  Female pattern hair loss Using topical minoxidil and spironolactone, but concerns about effectiveness. Plans to discuss options with dermatologist. - Discuss treatment options with dermatologist, including spironolactone adjustment and Propecia. - Continue topical minoxidil.         Return in about 6 months (around 06/02/2025).   Benton LITTIE Gave, PA     [1]  Social History Tobacco Use   Smoking status: Former   Smokeless tobacco: Never  Vaping Use   Vaping status: Never Used  Substance Use Topics   Alcohol use: Yes    Alcohol/week: 12.0 standard drinks of alcohol    Types: 2 Glasses of wine, 10 Shots of liquor per week    Comment: social   Drug use: Never   "

## 2024-12-09 ENCOUNTER — Ambulatory Visit: Admitting: Family Medicine

## 2025-06-02 ENCOUNTER — Ambulatory Visit: Admitting: Urgent Care
